# Patient Record
Sex: Male | Born: 1940 | Race: White | Hispanic: No | Marital: Married
Health system: Southern US, Community
[De-identification: ages and names within clinical notes are randomized; demographics above are authoritative.]

## PROBLEM LIST (undated history)

## (undated) DIAGNOSIS — J9621 Acute and chronic respiratory failure with hypoxia: Secondary | ICD-10-CM

## (undated) DIAGNOSIS — S272XXS Traumatic hemopneumothorax, sequela: Secondary | ICD-10-CM

## (undated) DIAGNOSIS — I482 Chronic atrial fibrillation, unspecified: Secondary | ICD-10-CM

## (undated) DIAGNOSIS — E871 Hypo-osmolality and hyponatremia: Secondary | ICD-10-CM

## (undated) DIAGNOSIS — I959 Hypotension, unspecified: Secondary | ICD-10-CM

## (undated) DIAGNOSIS — J189 Pneumonia, unspecified organism: Secondary | ICD-10-CM

---

## 2019-01-21 ENCOUNTER — Other Ambulatory Visit (HOSPITAL_COMMUNITY): Payer: Non-veteran care

## 2019-01-21 ENCOUNTER — Inpatient Hospital Stay
Admit: 2019-01-21 | Discharge: 2019-02-07 | Payer: Non-veteran care | Attending: Internal Medicine | Admitting: Internal Medicine

## 2019-01-21 DIAGNOSIS — J9621 Acute and chronic respiratory failure with hypoxia: Secondary | ICD-10-CM | POA: Diagnosis present

## 2019-01-21 DIAGNOSIS — I959 Hypotension, unspecified: Secondary | ICD-10-CM | POA: Diagnosis present

## 2019-01-21 DIAGNOSIS — J90 Pleural effusion, not elsewhere classified: Secondary | ICD-10-CM

## 2019-01-21 DIAGNOSIS — Z992 Dependence on renal dialysis: Secondary | ICD-10-CM

## 2019-01-21 DIAGNOSIS — J811 Chronic pulmonary edema: Secondary | ICD-10-CM

## 2019-01-21 DIAGNOSIS — S272XXS Traumatic hemopneumothorax, sequela: Secondary | ICD-10-CM

## 2019-01-21 DIAGNOSIS — J9690 Respiratory failure, unspecified, unspecified whether with hypoxia or hypercapnia: Secondary | ICD-10-CM

## 2019-01-21 DIAGNOSIS — J189 Pneumonia, unspecified organism: Secondary | ICD-10-CM

## 2019-01-21 DIAGNOSIS — R0603 Acute respiratory distress: Secondary | ICD-10-CM

## 2019-01-21 DIAGNOSIS — I509 Heart failure, unspecified: Secondary | ICD-10-CM

## 2019-01-21 DIAGNOSIS — N179 Acute kidney failure, unspecified: Secondary | ICD-10-CM

## 2019-01-21 DIAGNOSIS — I482 Chronic atrial fibrillation, unspecified: Secondary | ICD-10-CM | POA: Diagnosis present

## 2019-01-21 DIAGNOSIS — R509 Fever, unspecified: Secondary | ICD-10-CM

## 2019-01-21 DIAGNOSIS — Z930 Tracheostomy status: Secondary | ICD-10-CM

## 2019-01-21 DIAGNOSIS — Z4659 Encounter for fitting and adjustment of other gastrointestinal appliance and device: Secondary | ICD-10-CM

## 2019-01-21 DIAGNOSIS — E871 Hypo-osmolality and hyponatremia: Secondary | ICD-10-CM | POA: Diagnosis present

## 2019-01-21 HISTORY — DX: Hypotension, unspecified: I95.9

## 2019-01-21 HISTORY — DX: Acute and chronic respiratory failure with hypoxia: J96.21

## 2019-01-21 HISTORY — DX: Chronic atrial fibrillation, unspecified: I48.20

## 2019-01-21 HISTORY — DX: Pneumonia, unspecified organism: J18.9

## 2019-01-21 HISTORY — DX: Traumatic hemopneumothorax, sequela: S27.2XXS

## 2019-01-21 HISTORY — DX: Hypo-osmolality and hyponatremia: E87.1

## 2019-01-21 LAB — CBC WITH DIFFERENTIAL/PLATELET
Abs Immature Granulocytes: 0.09 10*3/uL — ABNORMAL HIGH (ref 0.00–0.07)
BASOS ABS: 0 10*3/uL (ref 0.0–0.1)
Basophils Relative: 0 %
EOS ABS: 0.2 10*3/uL (ref 0.0–0.5)
Eosinophils Relative: 1 %
HCT: 31.6 % — ABNORMAL LOW (ref 39.0–52.0)
Hemoglobin: 9.2 g/dL — ABNORMAL LOW (ref 13.0–17.0)
Immature Granulocytes: 1 %
Lymphocytes Relative: 10 %
Lymphs Abs: 1.2 10*3/uL (ref 0.7–4.0)
MCH: 29.7 pg (ref 26.0–34.0)
MCHC: 29.1 g/dL — ABNORMAL LOW (ref 30.0–36.0)
MCV: 101.9 fL — ABNORMAL HIGH (ref 80.0–100.0)
Monocytes Absolute: 0.5 10*3/uL (ref 0.1–1.0)
Monocytes Relative: 4 %
Neutro Abs: 10 10*3/uL — ABNORMAL HIGH (ref 1.7–7.7)
Neutrophils Relative %: 84 %
PLATELETS: UNDETERMINED 10*3/uL (ref 150–400)
RBC: 3.1 MIL/uL — ABNORMAL LOW (ref 4.22–5.81)
RDW: 16.2 % — AB (ref 11.5–15.5)
WBC: 12 10*3/uL — ABNORMAL HIGH (ref 4.0–10.5)
nRBC: 0 % (ref 0.0–0.2)

## 2019-01-21 LAB — COMPREHENSIVE METABOLIC PANEL
ALT: 60 U/L — ABNORMAL HIGH (ref 0–44)
AST: 53 U/L — ABNORMAL HIGH (ref 15–41)
Albumin: 1.5 g/dL — ABNORMAL LOW (ref 3.5–5.0)
Alkaline Phosphatase: 80 U/L (ref 38–126)
Anion gap: 2 — ABNORMAL LOW (ref 5–15)
BUN: 38 mg/dL — ABNORMAL HIGH (ref 8–23)
CO2: 31 mmol/L (ref 22–32)
Calcium: 7.7 mg/dL — ABNORMAL LOW (ref 8.9–10.3)
Chloride: 121 mmol/L — ABNORMAL HIGH (ref 98–111)
Creatinine, Ser: 1.04 mg/dL (ref 0.61–1.24)
GFR calc Af Amer: >60 mL/min (ref >60)
GFR calc non Af Amer: >60 mL/min (ref >60)
Glucose, Bld: 99 mg/dL (ref 70–99)
Potassium: 5.1 mmol/L (ref 3.5–5.1)
Sodium: 154 mmol/L — ABNORMAL HIGH (ref 135–145)
TOTAL PROTEIN: 6.2 g/dL — AB (ref 6.5–8.1)
Total Bilirubin: 0.4 mg/dL (ref 0.3–1.2)

## 2019-01-21 LAB — BLOOD GAS, ARTERIAL
Acid-Base Excess: 4.3 mmol/L — ABNORMAL HIGH (ref 0.0–2.0)
Bicarbonate: 30.1 mmol/L — ABNORMAL HIGH (ref 20.0–28.0)
FIO2: 40
O2 Saturation: 95.9 %
PEEP: 8 cmH2O
Patient temperature: 98.6
Pressure control: 24 cmH2O
pCO2 arterial: 61.6 mmHg — ABNORMAL HIGH (ref 32.0–48.0)
pH, Arterial: 7.31 — ABNORMAL LOW (ref 7.350–7.450)
pO2, Arterial: 88.1 mmHg (ref 83.0–108.0)

## 2019-01-21 LAB — PROTIME-INR
INR: 1.16
Prothrombin Time: 14.7 seconds (ref 11.4–15.2)

## 2019-01-22 DIAGNOSIS — E871 Hypo-osmolality and hyponatremia: Secondary | ICD-10-CM | POA: Diagnosis not present

## 2019-01-22 DIAGNOSIS — J189 Pneumonia, unspecified organism: Secondary | ICD-10-CM

## 2019-01-22 DIAGNOSIS — I482 Chronic atrial fibrillation, unspecified: Secondary | ICD-10-CM

## 2019-01-22 DIAGNOSIS — J9621 Acute and chronic respiratory failure with hypoxia: Secondary | ICD-10-CM

## 2019-01-22 DIAGNOSIS — I959 Hypotension, unspecified: Secondary | ICD-10-CM

## 2019-01-22 DIAGNOSIS — Z930 Tracheostomy status: Secondary | ICD-10-CM

## 2019-01-22 MED ORDER — GENERIC EXTERNAL MEDICATION
2.00 | Status: DC
Start: ? — End: 2019-01-22

## 2019-01-22 MED ORDER — ONDANSETRON HCL 4 MG/2ML IJ SOLN
4.00 | INTRAMUSCULAR | Status: DC
Start: ? — End: 2019-01-22

## 2019-01-22 MED ORDER — GENERIC EXTERNAL MEDICATION
Status: DC
Start: ? — End: 2019-01-22

## 2019-01-22 MED ORDER — IPRATROPIUM-ALBUTEROL 0.5-2.5 (3) MG/3ML IN SOLN
3.00 | RESPIRATORY_TRACT | Status: DC
Start: ? — End: 2019-01-22

## 2019-01-22 MED ORDER — CHLORHEXIDINE GLUCONATE 0.12 % MT SOLN
15.00 | OROMUCOSAL | Status: DC
Start: ? — End: 2019-01-22

## 2019-01-22 MED ORDER — ROSUVASTATIN CALCIUM 20 MG PO TABS
40.00 | ORAL_TABLET | ORAL | Status: DC
Start: 2019-01-21 — End: 2019-01-22

## 2019-01-22 MED ORDER — GENERIC EXTERNAL MEDICATION
40.00 | Status: DC
Start: 2019-01-22 — End: 2019-01-22

## 2019-01-22 MED ORDER — METOPROLOL TARTRATE 25 MG PO TABS
12.50 | ORAL_TABLET | ORAL | Status: DC
Start: 2019-01-21 — End: 2019-01-22

## 2019-01-22 MED ORDER — LEVOTHYROXINE SODIUM 25 MCG PO TABS
25.00 | ORAL_TABLET | ORAL | Status: DC
Start: 2019-01-22 — End: 2019-01-22

## 2019-01-22 MED ORDER — CHLORHEXIDINE GLUCONATE 0.12 % MT SOLN
15.00 | OROMUCOSAL | Status: DC
Start: 2019-01-21 — End: 2019-01-22

## 2019-01-22 MED ORDER — AMIODARONE HCL 200 MG PO TABS
200.00 | ORAL_TABLET | ORAL | Status: DC
Start: 2019-01-22 — End: 2019-01-22

## 2019-01-22 MED ORDER — GENERIC EXTERNAL MEDICATION
Status: DC
Start: 2019-01-22 — End: 2019-01-22

## 2019-01-22 MED ORDER — OXYCODONE-ACETAMINOPHEN 5-325 MG PO TABS
1.00 | ORAL_TABLET | ORAL | Status: DC
Start: ? — End: 2019-01-22

## 2019-01-22 MED ORDER — GENERIC EXTERNAL MEDICATION
400.00 | Status: DC
Start: 2019-01-21 — End: 2019-01-22

## 2019-01-22 MED ORDER — SENNOSIDES-DOCUSATE SODIUM 8.6-50 MG PO TABS
2.00 | ORAL_TABLET | ORAL | Status: DC
Start: 2019-01-21 — End: 2019-01-22

## 2019-01-22 MED ORDER — IPRATROPIUM-ALBUTEROL 0.5-2.5 (3) MG/3ML IN SOLN
3.00 | RESPIRATORY_TRACT | Status: DC
Start: 2019-01-21 — End: 2019-01-22

## 2019-01-22 MED ORDER — ACETAMINOPHEN 325 MG PO TABS
325.00 | ORAL_TABLET | ORAL | Status: DC
Start: 2019-01-21 — End: 2019-01-22
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
## 2019-01-23 ENCOUNTER — Other Ambulatory Visit (HOSPITAL_COMMUNITY): Payer: Non-veteran care

## 2019-01-23 ENCOUNTER — Encounter: Payer: Self-pay | Admitting: Internal Medicine

## 2019-01-23 DIAGNOSIS — I482 Chronic atrial fibrillation, unspecified: Secondary | ICD-10-CM | POA: Diagnosis not present

## 2019-01-23 DIAGNOSIS — J189 Pneumonia, unspecified organism: Secondary | ICD-10-CM | POA: Diagnosis not present

## 2019-01-23 DIAGNOSIS — R509 Fever, unspecified: Secondary | ICD-10-CM | POA: Diagnosis not present

## 2019-01-23 DIAGNOSIS — E871 Hypo-osmolality and hyponatremia: Secondary | ICD-10-CM | POA: Diagnosis present

## 2019-01-23 DIAGNOSIS — S272XXS Traumatic hemopneumothorax, sequela: Secondary | ICD-10-CM

## 2019-01-23 DIAGNOSIS — J9621 Acute and chronic respiratory failure with hypoxia: Secondary | ICD-10-CM | POA: Diagnosis not present

## 2019-01-23 DIAGNOSIS — I959 Hypotension, unspecified: Secondary | ICD-10-CM | POA: Diagnosis present

## 2019-01-23 LAB — CBC
HCT: 29.6 % — ABNORMAL LOW (ref 39.0–52.0)
HEMATOCRIT: 26.3 % — AB (ref 39.0–52.0)
Hemoglobin: 7.6 g/dL — ABNORMAL LOW (ref 13.0–17.0)
Hemoglobin: 8.5 g/dL — ABNORMAL LOW (ref 13.0–17.0)
MCH: 30.6 pg (ref 26.0–34.0)
MCH: 30.6 pg (ref 26.0–34.0)
MCHC: 28.9 g/dL — ABNORMAL LOW (ref 30.0–36.0)
MCHC: 28.7 g/dL — ABNORMAL LOW (ref 30.0–36.0)
MCV: 106 fL — ABNORMAL HIGH (ref 80.0–100.0)
MCV: 106.5 fL — AB (ref 80.0–100.0)
NRBC: 0 % (ref 0.0–0.2)
Platelets: 122 10*3/uL — ABNORMAL LOW (ref 150–400)
Platelets: DECREASED 10*3/uL (ref 150–400)
RBC: 2.48 MIL/uL — ABNORMAL LOW (ref 4.22–5.81)
RBC: 2.78 MIL/uL — ABNORMAL LOW (ref 4.22–5.81)
RDW: 16.2 % — AB (ref 11.5–15.5)
RDW: 16.1 % — ABNORMAL HIGH (ref 11.5–15.5)
WBC: 11.9 10*3/uL — ABNORMAL HIGH (ref 4.0–10.5)
WBC: 12.7 10*3/uL — ABNORMAL HIGH (ref 4.0–10.5)
nRBC: 0 % (ref 0.0–0.2)

## 2019-01-23 LAB — PROTIME-INR
INR: 1.31
Prothrombin Time: 16.1 seconds — ABNORMAL HIGH (ref 11.4–15.2)

## 2019-01-23 LAB — URINALYSIS, ROUTINE W REFLEX MICROSCOPIC
Bilirubin Urine: NEGATIVE
GLUCOSE, UA: NEGATIVE mg/dL
Hgb urine dipstick: NEGATIVE
Ketones, ur: NEGATIVE mg/dL
LEUKOCYTES UA: NEGATIVE
Nitrite: NEGATIVE
PROTEIN: NEGATIVE mg/dL
Specific Gravity, Urine: 1.021 (ref 1.005–1.030)
pH: 5 (ref 5.0–8.0)

## 2019-01-23 LAB — BASIC METABOLIC PANEL
Anion gap: 7 (ref 5–15)
BUN: 61 mg/dL — ABNORMAL HIGH (ref 8–23)
CO2: 24 mmol/L (ref 22–32)
Calcium: 7.4 mg/dL — ABNORMAL LOW (ref 8.9–10.3)
Chloride: 121 mmol/L — ABNORMAL HIGH (ref 98–111)
Creatinine, Ser: 1.8 mg/dL — ABNORMAL HIGH (ref 0.61–1.24)
GFR calc Af Amer: 41 mL/min — ABNORMAL LOW (ref >60)
GFR calc non Af Amer: 36 mL/min — ABNORMAL LOW (ref >60)
Glucose, Bld: 116 mg/dL — ABNORMAL HIGH (ref 70–99)
Potassium: 5.5 mmol/L — ABNORMAL HIGH (ref 3.5–5.1)
Sodium: 152 mmol/L — ABNORMAL HIGH (ref 135–145)

## 2019-01-23 MED ORDER — IOHEXOL 300 MG/ML  SOLN
100.0000 mL | Freq: Once | INTRAMUSCULAR | Status: AC | PRN
  Administered 2019-01-23: 100 mL via INTRAVENOUS
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
## 2019-01-24 DIAGNOSIS — J9621 Acute and chronic respiratory failure with hypoxia: Secondary | ICD-10-CM | POA: Diagnosis not present

## 2019-01-24 DIAGNOSIS — I482 Chronic atrial fibrillation, unspecified: Secondary | ICD-10-CM | POA: Diagnosis not present

## 2019-01-24 DIAGNOSIS — J189 Pneumonia, unspecified organism: Secondary | ICD-10-CM | POA: Diagnosis not present

## 2019-01-24 DIAGNOSIS — I959 Hypotension, unspecified: Secondary | ICD-10-CM | POA: Diagnosis not present

## 2019-01-24 LAB — CBC
HCT: 26.7 % — ABNORMAL LOW (ref 39.0–52.0)
HCT: 26 % — ABNORMAL LOW (ref 39.0–52.0)
HCT: 25.4 % — ABNORMAL LOW (ref 39.0–52.0)
HEMATOCRIT: 26.5 % — AB (ref 39.0–52.0)
HEMOGLOBIN: 7.4 g/dL — AB (ref 13.0–17.0)
Hemoglobin: 7.7 g/dL — ABNORMAL LOW (ref 13.0–17.0)
Hemoglobin: 7.3 g/dL — ABNORMAL LOW (ref 13.0–17.0)
Hemoglobin: 7.4 g/dL — ABNORMAL LOW (ref 13.0–17.0)
MCH: 30.9 pg (ref 26.0–34.0)
MCH: 29.6 pg (ref 26.0–34.0)
MCH: 31.1 pg (ref 26.0–34.0)
MCH: 29.8 pg (ref 26.0–34.0)
MCHC: 28.8 g/dL — ABNORMAL LOW (ref 30.0–36.0)
MCHC: 28.1 g/dL — ABNORMAL LOW (ref 30.0–36.0)
MCHC: 29.1 g/dL — ABNORMAL LOW (ref 30.0–36.0)
MCHC: 27.9 g/dL — ABNORMAL LOW (ref 30.0–36.0)
MCV: 107.2 fL — ABNORMAL HIGH (ref 80.0–100.0)
MCV: 105.3 fL — ABNORMAL HIGH (ref 80.0–100.0)
MCV: 106.7 fL — ABNORMAL HIGH (ref 80.0–100.0)
MCV: 106.9 fL — ABNORMAL HIGH (ref 80.0–100.0)
Platelets: 121 10*3/uL — ABNORMAL LOW (ref 150–400)
Platelets: 103 10*3/uL — ABNORMAL LOW (ref 150–400)
Platelets: 114 10*3/uL — ABNORMAL LOW (ref 150–400)
Platelets: 110 10*3/uL — ABNORMAL LOW (ref 150–400)
RBC: 2.49 MIL/uL — ABNORMAL LOW (ref 4.22–5.81)
RBC: 2.47 MIL/uL — ABNORMAL LOW (ref 4.22–5.81)
RBC: 2.38 MIL/uL — ABNORMAL LOW (ref 4.22–5.81)
RBC: 2.48 MIL/uL — ABNORMAL LOW (ref 4.22–5.81)
RDW: 16.7 % — ABNORMAL HIGH (ref 11.5–15.5)
RDW: 16.4 % — ABNORMAL HIGH (ref 11.5–15.5)
RDW: 16.8 % — ABNORMAL HIGH (ref 11.5–15.5)
RDW: 16.7 % — ABNORMAL HIGH (ref 11.5–15.5)
WBC: 10.5 10*3/uL (ref 4.0–10.5)
WBC: 9.7 10*3/uL (ref 4.0–10.5)
WBC: 11.3 10*3/uL — AB (ref 4.0–10.5)
WBC: 12.6 10*3/uL — ABNORMAL HIGH (ref 4.0–10.5)
nRBC: 0 % (ref 0.0–0.2)
nRBC: 0 % (ref 0.0–0.2)
nRBC: 0 % (ref 0.0–0.2)
nRBC: 0 % (ref 0.0–0.2)

## 2019-01-24 LAB — BLOOD CULTURE ID PANEL (REFLEXED)
Acinetobacter baumannii: NOT DETECTED
Candida albicans: NOT DETECTED
Candida glabrata: NOT DETECTED
Candida krusei: NOT DETECTED
Candida parapsilosis: NOT DETECTED
Candida tropicalis: NOT DETECTED
Enterobacter cloacae complex: NOT DETECTED
Enterobacteriaceae species: NOT DETECTED
Enterococcus species: NOT DETECTED
Escherichia coli: NOT DETECTED
Haemophilus influenzae: NOT DETECTED
Klebsiella oxytoca: NOT DETECTED
Klebsiella pneumoniae: NOT DETECTED
Listeria monocytogenes: NOT DETECTED
METHICILLIN RESISTANCE: DETECTED — AB
Neisseria meningitidis: NOT DETECTED
PROTEUS SPECIES: NOT DETECTED
Pseudomonas aeruginosa: NOT DETECTED
STAPHYLOCOCCUS SPECIES: DETECTED — AB
Serratia marcescens: NOT DETECTED
Staphylococcus aureus (BCID): NOT DETECTED
Streptococcus agalactiae: NOT DETECTED
Streptococcus pneumoniae: NOT DETECTED
Streptococcus pyogenes: NOT DETECTED
Streptococcus species: NOT DETECTED

## 2019-01-24 LAB — VANCOMYCIN, TROUGH: Vancomycin Tr: 20 ug/mL (ref 15–20)

## 2019-01-24 LAB — BASIC METABOLIC PANEL
ANION GAP: 8 (ref 5–15)
BUN: 68 mg/dL — ABNORMAL HIGH (ref 8–23)
CO2: 24 mmol/L (ref 22–32)
Calcium: 7.4 mg/dL — ABNORMAL LOW (ref 8.9–10.3)
Chloride: 120 mmol/L — ABNORMAL HIGH (ref 98–111)
Creatinine, Ser: 1.99 mg/dL — ABNORMAL HIGH (ref 0.61–1.24)
GFR calc Af Amer: 36 mL/min — ABNORMAL LOW (ref >60)
GFR calc non Af Amer: 31 mL/min — ABNORMAL LOW (ref >60)
Glucose, Bld: 125 mg/dL — ABNORMAL HIGH (ref 70–99)
POTASSIUM: 5.1 mmol/L (ref 3.5–5.1)
Sodium: 152 mmol/L — ABNORMAL HIGH (ref 135–145)

## 2019-01-24 LAB — URINE CULTURE: Culture: NO GROWTH

## 2019-01-24 LAB — C DIFFICILE QUICK SCREEN W PCR REFLEX
C DIFFICLE (CDIFF) ANTIGEN: POSITIVE — AB
C Diff toxin: NEGATIVE

## 2019-01-24 LAB — CLOSTRIDIUM DIFFICILE BY PCR, REFLEXED: CDIFFPCR: POSITIVE — AB

## 2019-01-24 NOTE — Progress Notes (Addendum)
Pulmonary Critical Care Medicine [place]   PULMONARY CRITICAL CARE SERVICE  PROGRESS NOTE  Date of Service: 01/24/2019  Gabriel Jensen  YTK:[id number]  DOB: Dec 09, 1941   DOA: 01/21/2019  Referring Physician: Merton Border, MD  HPI: Enrique Manganaro is a 78 y.o. male seen for follow up of Acute on Chronic Respiratory Failure.  Patient remains unstable for weaning at this time.  Continues on full support via ventilator.  Moderate secretions noted.  Medications: Reviewed on Rounds  Physical Exam:  Vitals: Pulse 86 respiration 16 BP 118/72 O2 sat 97% temp 97.0  Ventilator Settings ventilator mode AC PC rate 12 IP 24 PEEP of 5 FiO2 50%  . General: Comfortable at this time . Eyes: Grossly normal lids, irises & conjunctiva . ENT: grossly tongue is normal . Neck: no obvious mass . Cardiovascular: S1 S2 normal no gallop . Respiratory: Scattered rhonchi equal expansion . Abdomen: soft . Skin: no rash seen on limited exam . Musculoskeletal: not rigid . Psychiatric:unable to assess . Neurologic: no seizure no involuntary movements         Lab Data:   Basic Metabolic Panel: Recent Labs  Lab 01/21/19 1702 01/23/19 1636 01/24/19 0902  NA 154* 152* 152*  K 5.1 5.5* 5.1  CL 121* 121* 120*  CO2 31 24 24   GLUCOSE 99 116* 125*  BUN 38* 61* 68*  CREATININE 1.04 1.80* 1.99*  CALCIUM 7.7* 7.4* 7.4*    ABG: Recent Labs  Lab 01/21/19 1746  PHART 7.310*  PCO2ART 61.6*  PO2ART 88.1  HCO3 30.1*  O2SAT 95.9    Liver Function Tests: Recent Labs  Lab 01/21/19 1702  AST 53*  ALT 60*  ALKPHOS 80  BILITOT 0.4  PROT 6.2*  ALBUMIN 1.5*   No results for input(s): LIPASE, AMYLASE in the last 168 hours. No results for input(s): AMMONIA in the last 168 hours.  CBC: Recent Labs  Lab 01/21/19 1702  01/23/19 1949 01/24/19 0248 01/24/19 0723 01/24/19 1308 01/24/19 1909  WBC 12.0*   < > 12.7* 10.5 9.7 11.3* 12.6*  NEUTROABS 10.0*  --   --   --   --   --   --    HGB 9.2*   < > 8.5* 7.7* 7.3* 7.4* 7.4*  HCT 31.6*   < > 29.6* 26.7* 26.0* 25.4* 26.5*  MCV 101.9*   < > 106.5* 107.2* 105.3* 106.7* 106.9*  PLT PLATELET CLUMPS NOTED ON SMEAR, UNABLE TO ESTIMATE   < > PLATELET CLUMPS NOTED ON SMEAR, COUNT APPEARS DECREASED 121* 103* 114* 110*   < > = values in this interval not displayed.    Cardiac Enzymes: No results for input(s): CKTOTAL, CKMB, CKMBINDEX, TROPONINI in the last 168 hours.  BNP (last 3 results) No results for input(s): BNP in the last 8760 hours.  ProBNP (last 3 results) No results for input(s): PROBNP in the last 8760 hours.  Radiological Exams: Ct Abdomen Pelvis W Contrast  Result Date: 01/24/2019 CLINICAL DATA:  Retroperitoneal abscess EXAM: CT ABDOMEN AND PELVIS WITH CONTRAST TECHNIQUE: Multidetector CT imaging of the abdomen and pelvis was performed using the standard protocol following bolus administration of intravenous contrast. CONTRAST:  17mL OMNIPAQUE IOHEXOL 300 MG/ML  SOLN COMPARISON:  None. FINDINGS: Lower chest: Dense consolidation in both lower lobes. Moderate bilateral pleural effusions. Heart is normal size. Hepatobiliary: Scattered calcifications. No suspicious focal hepatic abnormality. Gallbladder is contracted. Pancreas: No focal abnormality or ductal dilatation. Spleen: Scattered calcifications. Adrenals/Urinary Tract: Left renal cysts, the largest  in the upper pole measures 3.2 cm. No hydronephrosis. Adrenal glands and urinary bladder unremarkable. Gas seen within the urinary bladder, presumably from recent catheterization. Stomach/Bowel: Appears to be mild colonic wall thickening diffusely although this is difficult to confirm or evaluate due to decompressed colon. Cannot exclude colitis. Sigmoid diverticulosis. Small bowel and stomach decompressed, unremarkable. NG tube is in place with the tip in the proximal duodenum. Vascular/Lymphatic: Prior aortic stent graft repair. Residual aneurysm sac measures 4 cm. No  adenopathy. Reproductive: No visible focal abnormality. Other: Small amount of free fluid in the pelvis. No free air. Musculoskeletal: No acute bony abnormality. IMPRESSION: Moderate bilateral effusions with dense consolidation in both lower lobes concerning for pneumonia. Apparent diffuse colonic wall thickening although difficult to evaluate due to decompression of the colon. Findings are concerning for pancolitis. Scattered left colonic diverticula. Prior stent graft repair of AAA. Aneurysm sac size 4 cm maximally. 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 Electronically Signed   By: Rolm Baptise M.D.   On: 01/24/2019 00:03   Dg Chest Port 1 View  Result Date: 01/23/2019 CLINICAL DATA:  Fever.  Follow-up. EXAM: PORTABLE CHEST 1 VIEW COMPARISON:  01/21/2019 FINDINGS: Tracheostomy remains in place. Nasogastric tube enters the abdomen. Widespread bilateral pulmonary opacity persists consistent with diffuse pneumonia. No dense consolidation or lobar collapse. Small bilateral effusions. IMPRESSION: Similar appearance to the study of 2 days ago. Widespread pulmonary infiltrates and small effusions. Electronically Signed   By: Nelson Chimes M.D.   On: 01/23/2019 13:40    Assessment/Plan Active Problems:   Acute on chronic respiratory failure with hypoxia (HCC)   Hyponatremia   Bilateral pneumonia   Chronic atrial fibrillation   Hemothorax with pneumothorax, traumatic, sequela   Hypotension   1. Acute on chronic respiratory failure with hypoxia patient remains on ventilator full support at this time.  Continue this full support and treat oxygen as tolerated. 2. Bilateral pneumonia patient had bilateral pleural effusions noted on CT scan.  Continue to follow cultures. 3. Chronic atrial fibrillation rate controlled continue present management 4. Hemothorax resolved, follow-up chest x-ray recommended 5. Hypertension resolved at this time 6. Hyponatremia ,sodium 152 this a.m.   I  have personally seen and evaluated the patient, evaluated laboratory and imaging results, formulated the assessment and plan and placed orders. The Patient requires high complexity decision making for assessment and support.  Case was discussed on Rounds with the Respiratory Therapy Staff  Allyne Gee, MD [facility] Pulmonary Critical Care Medicine Sleep Medicine

## 2019-01-25 DIAGNOSIS — I959 Hypotension, unspecified: Secondary | ICD-10-CM | POA: Diagnosis not present

## 2019-01-25 DIAGNOSIS — I482 Chronic atrial fibrillation, unspecified: Secondary | ICD-10-CM | POA: Diagnosis not present

## 2019-01-25 DIAGNOSIS — J189 Pneumonia, unspecified organism: Secondary | ICD-10-CM | POA: Diagnosis not present

## 2019-01-25 DIAGNOSIS — J9621 Acute and chronic respiratory failure with hypoxia: Secondary | ICD-10-CM | POA: Diagnosis not present

## 2019-01-25 LAB — CBC
HCT: 26.2 % — ABNORMAL LOW (ref 39.0–52.0)
HCT: 26.5 % — ABNORMAL LOW (ref 39.0–52.0)
HCT: 26.7 % — ABNORMAL LOW (ref 39.0–52.0)
HCT: 24.9 % — ABNORMAL LOW (ref 39.0–52.0)
Hemoglobin: 7.5 g/dL — ABNORMAL LOW (ref 13.0–17.0)
Hemoglobin: 7.4 g/dL — ABNORMAL LOW (ref 13.0–17.0)
Hemoglobin: 7.5 g/dL — ABNORMAL LOW (ref 13.0–17.0)
Hemoglobin: 7 g/dL — ABNORMAL LOW (ref 13.0–17.0)
MCH: 30.5 pg (ref 26.0–34.0)
MCH: 29.8 pg (ref 26.0–34.0)
MCH: 30 pg (ref 26.0–34.0)
MCH: 29.8 pg (ref 26.0–34.0)
MCHC: 28.6 g/dL — ABNORMAL LOW (ref 30.0–36.0)
MCHC: 27.9 g/dL — AB (ref 30.0–36.0)
MCHC: 28.1 g/dL — ABNORMAL LOW (ref 30.0–36.0)
MCHC: 28.1 g/dL — ABNORMAL LOW (ref 30.0–36.0)
MCV: 106.5 fL — ABNORMAL HIGH (ref 80.0–100.0)
MCV: 106.9 fL — ABNORMAL HIGH (ref 80.0–100.0)
MCV: 106.8 fL — ABNORMAL HIGH (ref 80.0–100.0)
MCV: 106 fL — AB (ref 80.0–100.0)
PLATELETS: 120 10*3/uL — AB (ref 150–400)
Platelets: 115 10*3/uL — ABNORMAL LOW (ref 150–400)
Platelets: 120 10*3/uL — ABNORMAL LOW (ref 150–400)
Platelets: 120 10*3/uL — ABNORMAL LOW (ref 150–400)
RBC: 2.46 MIL/uL — AB (ref 4.22–5.81)
RBC: 2.48 MIL/uL — ABNORMAL LOW (ref 4.22–5.81)
RBC: 2.5 MIL/uL — ABNORMAL LOW (ref 4.22–5.81)
RBC: 2.35 MIL/uL — ABNORMAL LOW (ref 4.22–5.81)
RDW: 16.6 % — ABNORMAL HIGH (ref 11.5–15.5)
RDW: 16.5 % — ABNORMAL HIGH (ref 11.5–15.5)
RDW: 16.4 % — AB (ref 11.5–15.5)
RDW: 16.4 % — ABNORMAL HIGH (ref 11.5–15.5)
WBC: 12.6 10*3/uL — ABNORMAL HIGH (ref 4.0–10.5)
WBC: 14.2 10*3/uL — ABNORMAL HIGH (ref 4.0–10.5)
WBC: 15.9 10*3/uL — ABNORMAL HIGH (ref 4.0–10.5)
WBC: 16.1 10*3/uL — ABNORMAL HIGH (ref 4.0–10.5)
nRBC: 0 % (ref 0.0–0.2)
nRBC: 0.1 % (ref 0.0–0.2)
nRBC: 0.1 % (ref 0.0–0.2)
nRBC: 0.2 % (ref 0.0–0.2)

## 2019-01-25 LAB — BASIC METABOLIC PANEL
Anion gap: 6 (ref 5–15)
BUN: 71 mg/dL — ABNORMAL HIGH (ref 8–23)
CO2: 23 mmol/L (ref 22–32)
Calcium: 7.6 mg/dL — ABNORMAL LOW (ref 8.9–10.3)
Chloride: 121 mmol/L — ABNORMAL HIGH (ref 98–111)
Creatinine, Ser: 2.14 mg/dL — ABNORMAL HIGH (ref 0.61–1.24)
GFR calc Af Amer: 33 mL/min — ABNORMAL LOW (ref >60)
GFR, EST NON AFRICAN AMERICAN: 29 mL/min — AB (ref >60)
Glucose, Bld: 179 mg/dL — ABNORMAL HIGH (ref 70–99)
POTASSIUM: 5.5 mmol/L — AB (ref 3.5–5.1)
Sodium: 150 mmol/L — ABNORMAL HIGH (ref 135–145)

## 2019-01-25 LAB — CULTURE, RESPIRATORY W GRAM STAIN

## 2019-01-25 LAB — CULTURE, BLOOD (ROUTINE X 2)

## 2019-01-25 NOTE — Progress Notes (Addendum)
Pulmonary Critical Care Medicine [place]   PULMONARY CRITICAL CARE SERVICE  PROGRESS NOTE  Date of Service: 01/25/2019  Arzell Mcgeehan  YKD:[id number]  DOB: December 24, 1940   DOA: 01/21/2019  Referring Physician: Merton Border, MD  HPI: Gabriel Jensen is a 78 y.o. male seen for follow up of Acute on Chronic Respiratory Failure.  Patient doing better today.  He continues on full ventilator support.  He has moderate to severe secretions noted.  We are still waiting on blood culture results at this time.  Medications: Reviewed on Rounds  Physical Exam:  Vitals: Pulse 68 respirations 28 BP 105/63 O2 sat 98% temp 97.6  Ventilator Settings ventilator mode AC PC rate of 12 IP 24 PEEP of 5 FiO2 40%  . General: Comfortable at this time . Eyes: Grossly normal lids, irises & conjunctiva . ENT: grossly tongue is normal . Neck: no obvious mass . Cardiovascular: S1 S2 normal no gallop . Respiratory: Scattered rhonchi expansion is equal . Abdomen: soft . Skin: no rash seen on limited exam . Musculoskeletal: not rigid . Psychiatric:unable to assess . Neurologic: no seizure no involuntary movements         Lab Data:   Basic Metabolic Panel: Recent Labs  Lab 01/21/19 1702 01/23/19 1636 01/24/19 0902 01/25/19 0156  NA 154* 152* 152* 150*  K 5.1 5.5* 5.1 5.5*  CL 121* 121* 120* 121*  CO2 31 24 24 23   GLUCOSE 99 116* 125* 179*  BUN 38* 61* 68* 71*  CREATININE 1.04 1.80* 1.99* 2.14*  CALCIUM 7.7* 7.4* 7.4* 7.6*    ABG: Recent Labs  Lab 01/21/19 1746  PHART 7.310*  PCO2ART 61.6*  PO2ART 88.1  HCO3 30.1*  O2SAT 95.9    Liver Function Tests: Recent Labs  Lab 01/21/19 1702  AST 53*  ALT 60*  ALKPHOS 80  BILITOT 0.4  PROT 6.2*  ALBUMIN 1.5*   No results for input(s): LIPASE, AMYLASE in the last 168 hours. No results for input(s): AMMONIA in the last 168 hours.  CBC: Recent Labs  Lab 01/21/19 1702  01/24/19 1308 01/24/19 1909 01/25/19 0156  01/25/19 0719 01/25/19 1304  WBC 12.0*   < > 11.3* 12.6* 12.6* 14.2* 15.9*  NEUTROABS 10.0*  --   --   --   --   --   --   HGB 9.2*   < > 7.4* 7.4* 7.5* 7.4* 7.5*  HCT 31.6*   < > 25.4* 26.5* 26.2* 26.5* 26.7*  MCV 101.9*   < > 106.7* 106.9* 106.5* 106.9* 106.8*  PLT PLATELET CLUMPS NOTED ON SMEAR, UNABLE TO ESTIMATE   < > 114* 110* 115* 120* 120*   < > = values in this interval not displayed.    Cardiac Enzymes: No results for input(s): CKTOTAL, CKMB, CKMBINDEX, TROPONINI in the last 168 hours.  BNP (last 3 results) No results for input(s): BNP in the last 8760 hours.  ProBNP (last 3 results) No results for input(s): PROBNP in the last 8760 hours.  Radiological Exams: Ct Abdomen Pelvis W Contrast  Result Date: 01/24/2019 CLINICAL DATA:  Retroperitoneal abscess EXAM: CT ABDOMEN AND PELVIS WITH CONTRAST TECHNIQUE: Multidetector CT imaging of the abdomen and pelvis was performed using the standard protocol following bolus administration of intravenous contrast. CONTRAST:  153mL OMNIPAQUE IOHEXOL 300 MG/ML  SOLN COMPARISON:  None. FINDINGS: Lower chest: Dense consolidation in both lower lobes. Moderate bilateral pleural effusions. Heart is normal size. Hepatobiliary: Scattered calcifications. No suspicious focal hepatic abnormality. Gallbladder is contracted.  Pancreas: No focal abnormality or ductal dilatation. Spleen: Scattered calcifications. Adrenals/Urinary Tract: Left renal cysts, the largest in the upper pole measures 3.2 cm. No hydronephrosis. Adrenal glands and urinary bladder unremarkable. Gas seen within the urinary bladder, presumably from recent catheterization. Stomach/Bowel: Appears to be mild colonic wall thickening diffusely although this is difficult to confirm or evaluate due to decompressed colon. Cannot exclude colitis. Sigmoid diverticulosis. Small bowel and stomach decompressed, unremarkable. NG tube is in place with the tip in the proximal duodenum. Vascular/Lymphatic:  Prior aortic stent graft repair. Residual aneurysm sac measures 4 cm. No adenopathy. Reproductive: No visible focal abnormality. Other: Small amount of free fluid in the pelvis. No free air. Musculoskeletal: No acute bony abnormality. IMPRESSION: Moderate bilateral effusions with dense consolidation in both lower lobes concerning for pneumonia. Apparent diffuse colonic wall thickening although difficult to evaluate due to decompression of the colon. Findings are concerning for pancolitis. Scattered left colonic diverticula. Prior stent graft repair of AAA. Aneurysm sac size 4 cm maximally. 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 Electronically Signed   By: Rolm Baptise M.D.   On: 01/24/2019 00:03    Assessment/Plan Active Problems:   Acute on chronic respiratory failure with hypoxia (HCC)   Hyponatremia   Bilateral pneumonia   Chronic atrial fibrillation   Hemothorax with pneumothorax, traumatic, sequela   Hypotension   1. Acute on chronic respiratory failure with hypoxia patient mains on ventilator full support at this time.  Continue full support and wean as tolerated per protocol. 2. Bilateral pneumonia continue to await cultures. 3. Chronic atrial fibrillation rate controlled continue present management 4. Hemothorax resolved follow-up chest x-ray recommended 5. Hypertension resolved at this time 6. Hyponatremia sodium was 150 at draw yesterday.   I have personally seen and evaluated the patient, evaluated laboratory and imaging results, formulated the assessment and plan and placed orders. The Patient requires high complexity decision making for assessment and support.  Case was discussed on Rounds with the Respiratory Therapy Staff  Allyne Gee, MD [facility] Pulmonary Critical Care Medicine Sleep Medicine

## 2019-01-26 ENCOUNTER — Other Ambulatory Visit (HOSPITAL_COMMUNITY): Payer: Non-veteran care

## 2019-01-26 DIAGNOSIS — J189 Pneumonia, unspecified organism: Secondary | ICD-10-CM | POA: Diagnosis not present

## 2019-01-26 DIAGNOSIS — J9621 Acute and chronic respiratory failure with hypoxia: Secondary | ICD-10-CM | POA: Diagnosis not present

## 2019-01-26 DIAGNOSIS — I482 Chronic atrial fibrillation, unspecified: Secondary | ICD-10-CM | POA: Diagnosis not present

## 2019-01-26 DIAGNOSIS — I959 Hypotension, unspecified: Secondary | ICD-10-CM | POA: Diagnosis not present

## 2019-01-26 LAB — CBC
HCT: 26.3 % — ABNORMAL LOW (ref 39.0–52.0)
HCT: 23.9 % — ABNORMAL LOW (ref 39.0–52.0)
HCT: 25.9 % — ABNORMAL LOW (ref 39.0–52.0)
HEMOGLOBIN: 7.5 g/dL — AB (ref 13.0–17.0)
Hemoglobin: 7 g/dL — ABNORMAL LOW (ref 13.0–17.0)
Hemoglobin: 7.3 g/dL — ABNORMAL LOW (ref 13.0–17.0)
MCH: 29.8 pg (ref 26.0–34.0)
MCH: 30.6 pg (ref 26.0–34.0)
MCH: 29.8 pg (ref 26.0–34.0)
MCHC: 28.5 g/dL — ABNORMAL LOW (ref 30.0–36.0)
MCHC: 29.3 g/dL — ABNORMAL LOW (ref 30.0–36.0)
MCHC: 28.2 g/dL — AB (ref 30.0–36.0)
MCV: 104.4 fL — ABNORMAL HIGH (ref 80.0–100.0)
MCV: 104.4 fL — ABNORMAL HIGH (ref 80.0–100.0)
MCV: 105.7 fL — ABNORMAL HIGH (ref 80.0–100.0)
NRBC: 0.2 % (ref 0.0–0.2)
Platelets: 132 10*3/uL — ABNORMAL LOW (ref 150–400)
Platelets: 92 10*3/uL — ABNORMAL LOW (ref 150–400)
Platelets: 109 10*3/uL — ABNORMAL LOW (ref 150–400)
RBC: 2.52 MIL/uL — ABNORMAL LOW (ref 4.22–5.81)
RBC: 2.29 MIL/uL — ABNORMAL LOW (ref 4.22–5.81)
RBC: 2.45 MIL/uL — AB (ref 4.22–5.81)
RDW: 16.3 % — ABNORMAL HIGH (ref 11.5–15.5)
RDW: 16.2 % — ABNORMAL HIGH (ref 11.5–15.5)
RDW: 16.4 % — ABNORMAL HIGH (ref 11.5–15.5)
WBC: 18.1 10*3/uL — AB (ref 4.0–10.5)
WBC: 12.7 10*3/uL — ABNORMAL HIGH (ref 4.0–10.5)
WBC: 17.3 10*3/uL — ABNORMAL HIGH (ref 4.0–10.5)
nRBC: 0.2 % (ref 0.0–0.2)
nRBC: 0.3 % — ABNORMAL HIGH (ref 0.0–0.2)

## 2019-01-26 LAB — CBC WITH DIFFERENTIAL/PLATELET
Abs Immature Granulocytes: 0.1 10*3/uL — ABNORMAL HIGH (ref 0.00–0.07)
Basophils Absolute: 0 10*3/uL (ref 0.0–0.1)
Basophils Relative: 0 %
Eosinophils Absolute: 0 10*3/uL (ref 0.0–0.5)
Eosinophils Relative: 0 %
HCT: 25 % — ABNORMAL LOW (ref 39.0–52.0)
HEMOGLOBIN: 7.4 g/dL — AB (ref 13.0–17.0)
Immature Granulocytes: 1 %
LYMPHS ABS: 0.7 10*3/uL (ref 0.7–4.0)
LYMPHS PCT: 4 %
MCH: 31.2 pg (ref 26.0–34.0)
MCHC: 29.6 g/dL — ABNORMAL LOW (ref 30.0–36.0)
MCV: 105.5 fL — ABNORMAL HIGH (ref 80.0–100.0)
Monocytes Absolute: 0.3 10*3/uL (ref 0.1–1.0)
Monocytes Relative: 2 %
Neutro Abs: 16.7 10*3/uL — ABNORMAL HIGH (ref 1.7–7.7)
Neutrophils Relative %: 93 %
Platelets: 126 10*3/uL — ABNORMAL LOW (ref 150–400)
RBC: 2.37 MIL/uL — ABNORMAL LOW (ref 4.22–5.81)
RDW: 16.4 % — ABNORMAL HIGH (ref 11.5–15.5)
WBC: 17.8 10*3/uL — ABNORMAL HIGH (ref 4.0–10.5)
nRBC: 0.2 % (ref 0.0–0.2)

## 2019-01-26 LAB — BASIC METABOLIC PANEL
Anion gap: 7 (ref 5–15)
BUN: 70 mg/dL — ABNORMAL HIGH (ref 8–23)
CHLORIDE: 120 mmol/L — AB (ref 98–111)
CO2: 21 mmol/L — ABNORMAL LOW (ref 22–32)
CREATININE: 2.03 mg/dL — AB (ref 0.61–1.24)
Calcium: 7.9 mg/dL — ABNORMAL LOW (ref 8.9–10.3)
GFR calc Af Amer: 36 mL/min — ABNORMAL LOW (ref >60)
GFR calc non Af Amer: 31 mL/min — ABNORMAL LOW (ref >60)
Glucose, Bld: 167 mg/dL — ABNORMAL HIGH (ref 70–99)
Potassium: 4.6 mmol/L (ref 3.5–5.1)
Sodium: 148 mmol/L — ABNORMAL HIGH (ref 135–145)

## 2019-01-26 LAB — PREPARE RBC (CROSSMATCH)

## 2019-01-26 LAB — ABO/RH: ABO/RH(D): AB POS

## 2019-01-26 LAB — VANCOMYCIN, TROUGH: Vancomycin Tr: 47 ug/mL (ref 15–20)

## 2019-01-26 NOTE — Progress Notes (Signed)
Pulmonary Critical Care Medicine [place]   PULMONARY CRITICAL CARE SERVICE  PROGRESS NOTE  Date of Service: 01/26/2019  Gabriel Jensen  TDV:[id number]  DOB: 1941-04-22   DOA: 01/21/2019  Referring Physician: Merton Border, MD  HPI: Gabriel Jensen is a 78 y.o. male seen for follow up of Acute on Chronic Respiratory Failure.  Currently on full support pressure assist control mode has been on 40% FiO2  Medications: Reviewed on Rounds  Physical Exam:  Vitals: Temperature 97.1 pulse 94 respiratory rate 21 blood pressure 143/71 saturation 99%  Ventilator Settings mode ventilation pressure assist control FiO2 40% tidal line 549 PEEP 5  . General: Comfortable at this time . Eyes: Grossly normal lids, irises & conjunctiva . ENT: grossly tongue is normal . Neck: no obvious mass . Cardiovascular: S1 S2 normal no gallop . Respiratory: Coarse rhonchi expansion is equal . Abdomen: soft . Skin: no rash seen on limited exam . Musculoskeletal: not rigid . Psychiatric:unable to assess . Neurologic: no seizure no involuntary movements         Lab Data:   Basic Metabolic Panel: Recent Labs  Lab 01/21/19 1702 01/23/19 1636 01/24/19 0902 01/25/19 0156 01/26/19 0216  NA 154* 152* 152* 150* 148*  K 5.1 5.5* 5.1 5.5* 4.6  CL 121* 121* 120* 121* 120*  CO2 31 24 24 23  21*  GLUCOSE 99 116* 125* 179* 167*  BUN 38* 61* 68* 71* 70*  CREATININE 1.04 1.80* 1.99* 2.14* 2.03*  CALCIUM 7.7* 7.4* 7.4* 7.6* 7.9*    ABG: Recent Labs  Lab 01/21/19 1746  PHART 7.310*  PCO2ART 61.6*  PO2ART 88.1  HCO3 30.1*  O2SAT 95.9    Liver Function Tests: Recent Labs  Lab 01/21/19 1702  AST 53*  ALT 60*  ALKPHOS 80  BILITOT 0.4  PROT 6.2*  ALBUMIN 1.5*   No results for input(s): LIPASE, AMYLASE in the last 168 hours. No results for input(s): AMMONIA in the last 168 hours.  CBC: Recent Labs  Lab 01/21/19 1702  01/25/19 0719 01/25/19 1304 01/25/19 2004 01/26/19 0216  01/26/19 0704  WBC 12.0*   < > 14.2* 15.9* 16.1* 17.8* 18.1*  NEUTROABS 10.0*  --   --   --   --  16.7*  --   HGB 9.2*   < > 7.4* 7.5* 7.0* 7.4* 7.5*  HCT 31.6*   < > 26.5* 26.7* 24.9* 25.0* 26.3*  MCV 101.9*   < > 106.9* 106.8* 106.0* 105.5* 104.4*  PLT PLATELET CLUMPS NOTED ON SMEAR, UNABLE TO ESTIMATE   < > 120* 120* 120* 126* 132*   < > = values in this interval not displayed.    Cardiac Enzymes: No results for input(s): CKTOTAL, CKMB, CKMBINDEX, TROPONINI in the last 168 hours.  BNP (last 3 results) No results for input(s): BNP in the last 8760 hours.  ProBNP (last 3 results) No results for input(s): PROBNP in the last 8760 hours.  Radiological Exams: Dg Chest Port 1 View  Result Date: 01/26/2019 CLINICAL DATA:  Pneumonia. EXAM: PORTABLE CHEST 1 VIEW COMPARISON:  01/23/2019 FINDINGS: Again noted is a tracheostomy tube and nasogastric tube. Nasogastric tube extends into the abdomen but the tip is beyond the image. Extensive bilateral parenchymal lung disease is again noted and airspace disease in the mid lungs has slightly progressed. Cardiac silhouette is obscured by the lung densities. Evidence of previous CABG procedure. Bibasilar densities are most compatible with pleural effusions. Negative for pneumothorax. IMPRESSION: Bilateral parenchymal lung disease has slightly  progressed. Findings are suggestive for pneumonia. Probable pleural effusions. Support apparatuses as described. Electronically Signed   By: Markus Daft M.D.   On: 01/26/2019 07:56    Assessment/Plan Active Problems:   Acute on chronic respiratory failure with hypoxia (HCC)   Hyponatremia   Bilateral pneumonia   Chronic atrial fibrillation   Hemothorax with pneumothorax, traumatic, sequela   Hypotension   1. Acute on chronic respiratory failure with hypoxia not weaning at this time remains on the ventilator full support will continue to titrate oxygen as tolerated 2. Hypernatremia clinically improving we will  continue to monitor 3. Bilateral pneumonia last chest x-ray still showing some infiltrate present we will continue to monitor has been treated with antibiotics 4. Chronic atrial fibrillation rate is controlled 5. Hemothorax with pneumothorax resolved continue with supportive care 6. Hypotension resolved   I have personally seen and evaluated the patient, evaluated laboratory and imaging results, formulated the assessment and plan and placed orders. The Patient requires high complexity decision making for assessment and support.  Case was discussed on Rounds with the Respiratory Therapy Staff  Allyne Gee, MD [facility] Pulmonary Critical Care Medicine Sleep Medicine

## 2019-01-27 DIAGNOSIS — S272XXS Traumatic hemopneumothorax, sequela: Secondary | ICD-10-CM | POA: Diagnosis not present

## 2019-01-27 DIAGNOSIS — J9621 Acute and chronic respiratory failure with hypoxia: Secondary | ICD-10-CM | POA: Diagnosis not present

## 2019-01-27 DIAGNOSIS — I482 Chronic atrial fibrillation, unspecified: Secondary | ICD-10-CM | POA: Diagnosis not present

## 2019-01-27 DIAGNOSIS — J189 Pneumonia, unspecified organism: Secondary | ICD-10-CM | POA: Diagnosis not present

## 2019-01-27 LAB — CBC
HCT: 27.3 % — ABNORMAL LOW (ref 39.0–52.0)
HCT: 25 % — ABNORMAL LOW (ref 39.0–52.0)
HCT: 30.6 % — ABNORMAL LOW (ref 39.0–52.0)
HCT: 27.2 % — ABNORMAL LOW (ref 39.0–52.0)
Hemoglobin: 7.9 g/dL — ABNORMAL LOW (ref 13.0–17.0)
Hemoglobin: 7.5 g/dL — ABNORMAL LOW (ref 13.0–17.0)
Hemoglobin: 8.4 g/dL — ABNORMAL LOW (ref 13.0–17.0)
Hemoglobin: 7.9 g/dL — ABNORMAL LOW (ref 13.0–17.0)
MCH: 30 pg (ref 26.0–34.0)
MCH: 31 pg (ref 26.0–34.0)
MCH: 29.3 pg (ref 26.0–34.0)
MCH: 30.4 pg (ref 26.0–34.0)
MCHC: 28.9 g/dL — ABNORMAL LOW (ref 30.0–36.0)
MCHC: 30 g/dL (ref 30.0–36.0)
MCHC: 27.5 g/dL — ABNORMAL LOW (ref 30.0–36.0)
MCHC: 29 g/dL — ABNORMAL LOW (ref 30.0–36.0)
MCV: 103.8 fL — ABNORMAL HIGH (ref 80.0–100.0)
MCV: 103.3 fL — ABNORMAL HIGH (ref 80.0–100.0)
MCV: 106.6 fL — ABNORMAL HIGH (ref 80.0–100.0)
MCV: 104.6 fL — ABNORMAL HIGH (ref 80.0–100.0)
NRBC: 0.4 % — AB (ref 0.0–0.2)
Platelets: 104 10*3/uL — ABNORMAL LOW (ref 150–400)
Platelets: 80 10*3/uL — ABNORMAL LOW (ref 150–400)
Platelets: 114 10*3/uL — ABNORMAL LOW (ref 150–400)
Platelets: 72 10*3/uL — ABNORMAL LOW (ref 150–400)
RBC: 2.63 MIL/uL — ABNORMAL LOW (ref 4.22–5.81)
RBC: 2.42 MIL/uL — ABNORMAL LOW (ref 4.22–5.81)
RBC: 2.87 MIL/uL — ABNORMAL LOW (ref 4.22–5.81)
RBC: 2.6 MIL/uL — AB (ref 4.22–5.81)
RDW: 16.7 % — ABNORMAL HIGH (ref 11.5–15.5)
RDW: 16.8 % — ABNORMAL HIGH (ref 11.5–15.5)
RDW: 16.8 % — AB (ref 11.5–15.5)
RDW: 16.7 % — ABNORMAL HIGH (ref 11.5–15.5)
WBC: 16.1 10*3/uL — ABNORMAL HIGH (ref 4.0–10.5)
WBC: 11.9 10*3/uL — ABNORMAL HIGH (ref 4.0–10.5)
WBC: 18.4 10*3/uL — ABNORMAL HIGH (ref 4.0–10.5)
WBC: 13.1 10*3/uL — ABNORMAL HIGH (ref 4.0–10.5)
nRBC: 0.3 % — ABNORMAL HIGH (ref 0.0–0.2)
nRBC: 0.3 % — ABNORMAL HIGH (ref 0.0–0.2)
nRBC: 0.2 % (ref 0.0–0.2)

## 2019-01-27 LAB — BASIC METABOLIC PANEL
Anion gap: 6 (ref 5–15)
BUN: 75 mg/dL — ABNORMAL HIGH (ref 8–23)
CO2: 21 mmol/L — ABNORMAL LOW (ref 22–32)
Calcium: 7.9 mg/dL — ABNORMAL LOW (ref 8.9–10.3)
Chloride: 122 mmol/L — ABNORMAL HIGH (ref 98–111)
Creatinine, Ser: 2.08 mg/dL — ABNORMAL HIGH (ref 0.61–1.24)
GFR calc Af Amer: 35 mL/min — ABNORMAL LOW (ref >60)
GFR calc non Af Amer: 30 mL/min — ABNORMAL LOW (ref >60)
Glucose, Bld: 173 mg/dL — ABNORMAL HIGH (ref 70–99)
Potassium: 4.9 mmol/L (ref 3.5–5.1)
Sodium: 149 mmol/L — ABNORMAL HIGH (ref 135–145)

## 2019-01-27 LAB — BPAM RBC
Blood Product Expiration Date: 202003052359
ISSUE DATE / TIME: 202002102339
UNIT TYPE AND RH: 8400

## 2019-01-27 LAB — TYPE AND SCREEN
ABO/RH(D): AB POS
ANTIBODY SCREEN: NEGATIVE
UNIT DIVISION: 0

## 2019-01-27 LAB — LACTIC ACID, PLASMA: Lactic Acid, Venous: 1.3 mmol/L (ref 0.5–1.9)

## 2019-01-27 LAB — VANCOMYCIN, TROUGH: Vancomycin Tr: 39 ug/mL (ref 15–20)

## 2019-01-27 NOTE — Progress Notes (Signed)
Pulmonary Critical Care Medicine [place]   PULMONARY CRITICAL CARE SERVICE  PROGRESS NOTE  Date of Service: 01/27/2019  Gabriel Jensen  JOI:[id number]  DOB: [birth year]   DOA: 01/21/2019  Referring Physician: Merton Border, MD  HPI: Gabriel Jensen is a 78 y.o. male seen for follow up of Acute on Chronic Respiratory Failure.  Patient is on pressure support mode was able to do about 4 hours yesterday  Medications: Reviewed on Rounds  Physical Exam:  Vitals: Temperature 97.3 pulse 63 respiratory rate 20 blood pressure 111/54 saturations 97%  Ventilator Settings mode ventilation pressure support FiO2 30% pressure 12 PEEP 5  . General: Comfortable at this time . Eyes: Grossly normal lids, irises & conjunctiva . ENT: grossly tongue is normal . Neck: no obvious mass . Cardiovascular: S1 S2 normal no gallop . Respiratory: Scattered rhonchi expansion is equal . Abdomen: soft . Skin: no rash seen on limited exam . Musculoskeletal: not rigid . Psychiatric:unable to assess . Neurologic: no seizure no involuntary movements         Lab Data:   Basic Metabolic Panel: Recent Labs  Lab 01/23/19 1636 01/24/19 0902 01/25/19 0156 01/26/19 0216 01/27/19 0246  NA 152* 152* 150* 148* 149*  K 5.5* 5.1 5.5* 4.6 4.9  CL 121* 120* 121* 120* 122*  CO2 24 24 23  21* 21*  GLUCOSE 116* 125* 179* 167* 173*  BUN 61* 68* 71* 70* 75*  CREATININE 1.80* 1.99* 2.14* 2.03* 2.08*  CALCIUM 7.4* 7.4* 7.6* 7.9* 7.9*    ABG: Recent Labs  Lab 01/21/19 1746  PHART 7.310*  PCO2ART 61.6*  PO2ART 88.1  HCO3 30.1*  O2SAT 95.9    Liver Function Tests: Recent Labs  Lab 01/21/19 1702  AST 53*  ALT 60*  ALKPHOS 80  BILITOT 0.4  PROT 6.2*  ALBUMIN 1.5*   No results for input(s): LIPASE, AMYLASE in the last 168 hours. No results for input(s): AMMONIA in the last 168 hours.  CBC: Recent Labs  Lab 01/21/19 1702  01/26/19 0216  01/26/19 1338 01/26/19 2041 01/27/19 0246  01/27/19 0849 01/27/19 1304  WBC 12.0*   < > 17.8*   < > 12.7* 17.3* 16.1* 11.9* 18.4*  NEUTROABS 10.0*  --  16.7*  --   --   --   --   --   --   HGB 9.2*   < > 7.4*   < > 7.0* 7.3* 7.9* 7.5* 8.4*  HCT 31.6*   < > 25.0*   < > 23.9* 25.9* 27.3* 25.0* 30.6*  MCV 101.9*   < > 105.5*   < > 104.4* 105.7* 103.8* 103.3* 106.6*  PLT PLATELET CLUMPS NOTED ON SMEAR, UNABLE TO ESTIMATE   < > 126*   < > 92* 109* 104* 80* 114*   < > = values in this interval not displayed.    Cardiac Enzymes: No results for input(s): CKTOTAL, CKMB, CKMBINDEX, TROPONINI in the last 168 hours.  BNP (last 3 results) No results for input(s): BNP in the last 8760 hours.  ProBNP (last 3 results) No results for input(s): PROBNP in the last 8760 hours.  Radiological Exams: Dg Chest Port 1 View  Result Date: 01/26/2019 CLINICAL DATA:  Pneumonia. EXAM: PORTABLE CHEST 1 VIEW COMPARISON:  01/23/2019 FINDINGS: Again noted is a tracheostomy tube and nasogastric tube. Nasogastric tube extends into the abdomen but the tip is beyond the image. Extensive bilateral parenchymal lung disease is again noted and airspace disease in the mid lungs has  slightly progressed. Cardiac silhouette is obscured by the lung densities. Evidence of previous CABG procedure. Bibasilar densities are most compatible with pleural effusions. Negative for pneumothorax. IMPRESSION: Bilateral parenchymal lung disease has slightly progressed. Findings are suggestive for pneumonia. Probable pleural effusions. Support apparatuses as described. Electronically Signed   By: Markus Daft M.D.   On: 01/26/2019 07:56    Assessment/Plan Active Problems:   Acute on chronic respiratory failure with hypoxia (HCC)   Hyponatremia   Bilateral pneumonia   Chronic atrial fibrillation   Hemothorax with pneumothorax, traumatic, sequela   Hypotension   1. Acute on chronic respiratory failure with hypoxia we will continue with pressure support mode titrate oxygen as  tolerated 2. Hyponatremia clinically following labs 3. Bilateral pneumonia treated patient still has some mild bilateral parenchymal disease based on yesterday's film we will continue to follow clinically 4. Chronic atrial fibrillation rate controlled 5. Hemothorax pneumothorax negative findings on the chest film   I have personally seen and evaluated the patient, evaluated laboratory and imaging results, formulated the assessment and plan and placed orders. The Patient requires high complexity decision making for assessment and support.  Case was discussed on Rounds with the Respiratory Therapy Staff  Allyne Gee, MD [facility] Pulmonary Critical Care Medicine Sleep Medicine

## 2019-01-28 DIAGNOSIS — J189 Pneumonia, unspecified organism: Secondary | ICD-10-CM | POA: Diagnosis not present

## 2019-01-28 DIAGNOSIS — J9621 Acute and chronic respiratory failure with hypoxia: Secondary | ICD-10-CM | POA: Diagnosis not present

## 2019-01-28 DIAGNOSIS — S272XXS Traumatic hemopneumothorax, sequela: Secondary | ICD-10-CM | POA: Diagnosis not present

## 2019-01-28 DIAGNOSIS — I482 Chronic atrial fibrillation, unspecified: Secondary | ICD-10-CM | POA: Diagnosis not present

## 2019-01-28 LAB — CBC
HCT: 26.8 % — ABNORMAL LOW (ref 39.0–52.0)
Hemoglobin: 7.9 g/dL — ABNORMAL LOW (ref 13.0–17.0)
MCH: 30.6 pg (ref 26.0–34.0)
MCHC: 29.5 g/dL — ABNORMAL LOW (ref 30.0–36.0)
MCV: 103.9 fL — ABNORMAL HIGH (ref 80.0–100.0)
Platelets: 65 10*3/uL — ABNORMAL LOW (ref 150–400)
RBC: 2.58 MIL/uL — ABNORMAL LOW (ref 4.22–5.81)
RDW: 16.6 % — ABNORMAL HIGH (ref 11.5–15.5)
WBC: 11.3 10*3/uL — ABNORMAL HIGH (ref 4.0–10.5)
nRBC: 0.2 % (ref 0.0–0.2)

## 2019-01-28 LAB — BASIC METABOLIC PANEL
ANION GAP: 8 (ref 5–15)
BUN: 86 mg/dL — ABNORMAL HIGH (ref 8–23)
CO2: 20 mmol/L — ABNORMAL LOW (ref 22–32)
Calcium: 7.7 mg/dL — ABNORMAL LOW (ref 8.9–10.3)
Chloride: 118 mmol/L — ABNORMAL HIGH (ref 98–111)
Creatinine, Ser: 2.52 mg/dL — ABNORMAL HIGH (ref 0.61–1.24)
GFR calc Af Amer: 27 mL/min — ABNORMAL LOW (ref >60)
GFR calc non Af Amer: 24 mL/min — ABNORMAL LOW (ref >60)
Glucose, Bld: 159 mg/dL — ABNORMAL HIGH (ref 70–99)
Potassium: 4.9 mmol/L (ref 3.5–5.1)
SODIUM: 146 mmol/L — AB (ref 135–145)

## 2019-01-28 LAB — CULTURE, BLOOD (ROUTINE X 2): CULTURE: NO GROWTH

## 2019-01-28 LAB — VANCOMYCIN, TROUGH: Vancomycin Tr: 33 ug/mL (ref 15–20)

## 2019-01-28 LAB — MAGNESIUM: MAGNESIUM: 2.4 mg/dL (ref 1.7–2.4)

## 2019-01-28 NOTE — Progress Notes (Addendum)
Pulmonary Critical Care Medicine [place]   PULMONARY CRITICAL CARE SERVICE  PROGRESS NOTE  Date of Service: 01/28/2019  Gabriel Jensen  WVP:[id number]  DOB: 1941-01-29   DOA: 01/21/2019  Referring Physician: Merton Border, MD  HPI: Gabriel Jensen is a 78 y.o. male seen for follow up of Acute on Chronic Respiratory Failure.  Patient continues to rest on pressure control assist mode.  FiO2 is currently 40%.  Patient was unable to wean today and is resting on the vent at this time on pressure control.  Medications: Reviewed on Rounds  Physical Exam:  Vitals: Pulse 64 respirations 21 BP 120/66 O2 sat 89% temp 96.6  Ventilator Settings ventilator mode AC PC rate of 12 IP 24 PEEP 5 FiO2 40%  . General: Comfortable at this time . Eyes: Grossly normal lids, irises & conjunctiva . ENT: grossly tongue is normal . Neck: no obvious mass . Cardiovascular: S1 S2 normal no gallop . Respiratory: Scattered rhonchi expansion equal . Abdomen: soft . Skin: no rash seen on limited exam . Musculoskeletal: not rigid . Psychiatric:unable to assess . Neurologic: no seizure no involuntary movements         Lab Data:   Basic Metabolic Panel: Recent Labs  Lab 01/24/19 0902 01/25/19 0156 01/26/19 0216 01/27/19 0246 01/28/19 0506  NA 152* 150* 148* 149* 146*  K 5.1 5.5* 4.6 4.9 4.9  CL 120* 121* 120* 122* 118*  CO2 24 23 21* 21* 20*  GLUCOSE 125* 179* 167* 173* 159*  BUN 68* 71* 70* 75* 86*  CREATININE 1.99* 2.14* 2.03* 2.08* 2.52*  CALCIUM 7.4* 7.6* 7.9* 7.9* 7.7*  MG  --   --   --   --  2.4    ABG: No results for input(s): PHART, PCO2ART, PO2ART, HCO3, O2SAT in the last 168 hours.  Liver Function Tests: No results for input(s): AST, ALT, ALKPHOS, BILITOT, PROT, ALBUMIN in the last 168 hours. No results for input(s): LIPASE, AMYLASE in the last 168 hours. No results for input(s): AMMONIA in the last 168 hours.  CBC: Recent Labs  Lab 01/26/19 0216   01/27/19 0246 01/27/19 0849 01/27/19 1304 01/27/19 1930 01/28/19 0506  WBC 17.8*   < > 16.1* 11.9* 18.4* 13.1* 11.3*  NEUTROABS 16.7*  --   --   --   --   --   --   HGB 7.4*   < > 7.9* 7.5* 8.4* 7.9* 7.9*  HCT 25.0*   < > 27.3* 25.0* 30.6* 27.2* 26.8*  MCV 105.5*   < > 103.8* 103.3* 106.6* 104.6* 103.9*  PLT 126*   < > 104* 80* 114* 72* 65*   < > = values in this interval not displayed.    Cardiac Enzymes: No results for input(s): CKTOTAL, CKMB, CKMBINDEX, TROPONINI in the last 168 hours.  BNP (last 3 results) No results for input(s): BNP in the last 8760 hours.  ProBNP (last 3 results) No results for input(s): PROBNP in the last 8760 hours.  Radiological Exams: No results found.  Assessment/Plan Active Problems:   Acute on chronic respiratory failure with hypoxia (HCC)   Hyponatremia   Bilateral pneumonia   Chronic atrial fibrillation   Hemothorax with pneumothorax, traumatic, sequela   Hypotension   1. Acute on tonic respiratory failure with hypoxia continue pressure support mode titrate oxygen as tolerated.  Continue aggressive pulmonary toilet. 2. Hyponatremia clinically resolved, continue to follow labs. 3. Bilateral pneumonia treated continue to follow 4. Chronic atrial fibrillation rate trolled 5.  Hemothorax pneumothorax negative findings on chest film   I have personally seen and evaluated the patient, evaluated laboratory and imaging results, formulated the assessment and plan and placed orders. The Patient requires high complexity decision making for assessment and support.  Case was discussed on Rounds with the Respiratory Therapy Staff  Allyne Gee, MD [facility] Pulmonary Critical Care Medicine Sleep Medicine

## 2019-01-29 ENCOUNTER — Other Ambulatory Visit (HOSPITAL_COMMUNITY): Payer: Non-veteran care

## 2019-01-29 DIAGNOSIS — J9621 Acute and chronic respiratory failure with hypoxia: Secondary | ICD-10-CM | POA: Diagnosis not present

## 2019-01-29 DIAGNOSIS — I482 Chronic atrial fibrillation, unspecified: Secondary | ICD-10-CM | POA: Diagnosis not present

## 2019-01-29 DIAGNOSIS — J189 Pneumonia, unspecified organism: Secondary | ICD-10-CM | POA: Diagnosis not present

## 2019-01-29 DIAGNOSIS — S272XXS Traumatic hemopneumothorax, sequela: Secondary | ICD-10-CM | POA: Diagnosis not present

## 2019-01-29 LAB — CBC
HCT: 25.9 % — ABNORMAL LOW (ref 39.0–52.0)
Hemoglobin: 7.4 g/dL — ABNORMAL LOW (ref 13.0–17.0)
MCH: 29.1 pg (ref 26.0–34.0)
MCHC: 28.6 g/dL — ABNORMAL LOW (ref 30.0–36.0)
MCV: 102 fL — ABNORMAL HIGH (ref 80.0–100.0)
Platelets: 67 K/uL — ABNORMAL LOW (ref 150–400)
RBC: 2.54 MIL/uL — ABNORMAL LOW (ref 4.22–5.81)
RDW: 15.9 % — ABNORMAL HIGH (ref 11.5–15.5)
WBC: 9.8 K/uL (ref 4.0–10.5)
nRBC: 0 % (ref 0.0–0.2)

## 2019-01-29 NOTE — Progress Notes (Addendum)
Pulmonary Critical Care Medicine [place]   PULMONARY CRITICAL CARE SERVICE  PROGRESS NOTE  Date of Service: 01/29/2019  Loi Rennaker  LNL:[id number]  DOB: 1941-07-26   DOA: 01/21/2019  Referring Physician: Merton Border, MD  HPI: Gabriel Jensen is a 78 y.o. male seen for follow up of Acute on Chronic Respiratory Failure.  Patient has an 8-hour goal today on pressure support 12/5 FiO2 40%.  Currently doing well with minimal secretions, will most likely meet goal today.  Medications: Reviewed on Rounds  Physical Exam:  Vitals: Pulse 60 respirations 12 BP 104/57 O2 sat 99% temp 96.2  Ventilator Settings ventilator mode currently pressure support 12/5 FiO2 40% Patient will rest on ventilator mode AC PC rate of 12 IP 24 PEEP of 5 FiO2 35%.  . General: Comfortable at this time . Eyes: Grossly normal lids, irises & conjunctiva . ENT: grossly tongue is normal . Neck: no obvious mass . Cardiovascular: S1 S2 normal no gallop . Respiratory: Scattered rhonchi . Abdomen: soft . Skin: no rash seen on limited exam . Musculoskeletal: not rigid . Psychiatric:unable to assess . Neurologic: no seizure no involuntary movements         Lab Data:   Basic Metabolic Panel: Recent Labs  Lab 01/24/19 0902 01/25/19 0156 01/26/19 0216 01/27/19 0246 01/28/19 0506  NA 152* 150* 148* 149* 146*  K 5.1 5.5* 4.6 4.9 4.9  CL 120* 121* 120* 122* 118*  CO2 24 23 21* 21* 20*  GLUCOSE 125* 179* 167* 173* 159*  BUN 68* 71* 70* 75* 86*  CREATININE 1.99* 2.14* 2.03* 2.08* 2.52*  CALCIUM 7.4* 7.6* 7.9* 7.9* 7.7*  MG  --   --   --   --  2.4    ABG: No results for input(s): PHART, PCO2ART, PO2ART, HCO3, O2SAT in the last 168 hours.  Liver Function Tests: No results for input(s): AST, ALT, ALKPHOS, BILITOT, PROT, ALBUMIN in the last 168 hours. No results for input(s): LIPASE, AMYLASE in the last 168 hours. No results for input(s): AMMONIA in the last 168 hours.  CBC: Recent  Labs  Lab 01/26/19 0216  01/27/19 0849 01/27/19 1304 01/27/19 1930 01/28/19 0506 01/29/19 0613  WBC 17.8*   < > 11.9* 18.4* 13.1* 11.3* 9.8  NEUTROABS 16.7*  --   --   --   --   --   --   HGB 7.4*   < > 7.5* 8.4* 7.9* 7.9* 7.4*  HCT 25.0*   < > 25.0* 30.6* 27.2* 26.8* 25.9*  MCV 105.5*   < > 103.3* 106.6* 104.6* 103.9* 102.0*  PLT 126*   < > 80* 114* 72* 65* 67*   < > = values in this interval not displayed.    Cardiac Enzymes: No results for input(s): CKTOTAL, CKMB, CKMBINDEX, TROPONINI in the last 168 hours.  BNP (last 3 results) No results for input(s): BNP in the last 8760 hours.  ProBNP (last 3 results) No results for input(s): PROBNP in the last 8760 hours.  Radiological Exams: US Renal  Result Date: 01/29/2019 CLINICAL DATA:  Acute kidney injury. EXAM: RENAL / URINARY TRACT ULTRASOUND COMPLETE COMPARISON:  None. FINDINGS: Right Kidney: Renal measurements: 10.2 x 5.6 x 5.2 cm = volume: 155 mL . Echogenicity within normal limits. No mass or hydronephrosis visualized. Left Kidney: Renal measurements: 10.6 x 6.2 x 5.8 cm = volume: 101 ML. 2.2 cm simple cyst is seen in midpole. Echogenicity within normal limits. No mass or hydronephrosis visualized. Bladder: Decompressed secondary  to Foley catheter.  Ascites is noted. IMPRESSION: No significant renal abnormality seen. Mild ascites is noted. Bladder decompressed secondary to Foley catheter. Electronically Signed   By: Marijo Conception, M.D.   On: 01/29/2019 16:22    Assessment/Plan Active Problems:   Acute on chronic respiratory failure with hypoxia (HCC)   Hyponatremia   Bilateral pneumonia   Chronic atrial fibrillation   Hemothorax with pneumothorax, traumatic, sequela   Hypotension   1. Acute on chronic respiratory failure with hypoxia continue pressure support mode and titrate oxygen as tolerated.  Continue aggressive pulmonary toilet.  Goal today on pressure support 8 hours continue to increase as  tolerated. 2. Hyponatremia clinically resolved continue to follow labs 3. Bilateral pneumonia treated continue to follow 4. Chronic atrial fibrillation rate controlled 5. Hemothorax pneumothorax negative findings on chest film   I have personally seen and evaluated the patient, evaluated laboratory and imaging results, formulated the assessment and plan and placed orders. The Patient requires high complexity decision making for assessment and support.  Case was discussed on Rounds with the Respiratory Therapy Staff  Allyne Gee, MD [facility] Pulmonary Critical Care Medicine Sleep Medicine

## 2019-01-29 NOTE — Progress Notes (Signed)
  Echocardiogram 2D Echocardiogram has been performed.  Darlina Sicilian M 01/29/2019, 9:35 AM

## 2019-01-30 ENCOUNTER — Other Ambulatory Visit (HOSPITAL_COMMUNITY): Payer: Non-veteran care

## 2019-01-30 DIAGNOSIS — J189 Pneumonia, unspecified organism: Secondary | ICD-10-CM | POA: Diagnosis not present

## 2019-01-30 DIAGNOSIS — J9621 Acute and chronic respiratory failure with hypoxia: Secondary | ICD-10-CM | POA: Diagnosis not present

## 2019-01-30 DIAGNOSIS — S272XXS Traumatic hemopneumothorax, sequela: Secondary | ICD-10-CM | POA: Diagnosis not present

## 2019-01-30 DIAGNOSIS — I482 Chronic atrial fibrillation, unspecified: Secondary | ICD-10-CM | POA: Diagnosis not present

## 2019-01-30 LAB — CBC
HCT: 22.7 % — ABNORMAL LOW (ref 39.0–52.0)
Hemoglobin: 6.9 g/dL — CL (ref 13.0–17.0)
MCH: 30.9 pg (ref 26.0–34.0)
MCHC: 30.4 g/dL (ref 30.0–36.0)
MCV: 101.8 fL — ABNORMAL HIGH (ref 80.0–100.0)
Platelets: 65 10*3/uL — ABNORMAL LOW (ref 150–400)
RBC: 2.23 MIL/uL — ABNORMAL LOW (ref 4.22–5.81)
RDW: 15.9 % — ABNORMAL HIGH (ref 11.5–15.5)
WBC: 8.1 10*3/uL (ref 4.0–10.5)
nRBC: 0 % (ref 0.0–0.2)

## 2019-01-30 LAB — COMPREHENSIVE METABOLIC PANEL
ALBUMIN: 1.6 g/dL — AB (ref 3.5–5.0)
ALT: 27 U/L (ref 0–44)
AST: 22 U/L (ref 15–41)
Alkaline Phosphatase: 57 U/L (ref 38–126)
Anion gap: 8 (ref 5–15)
BUN: 103 mg/dL — ABNORMAL HIGH (ref 8–23)
CO2: 18 mmol/L — ABNORMAL LOW (ref 22–32)
Calcium: 7.3 mg/dL — ABNORMAL LOW (ref 8.9–10.3)
Chloride: 119 mmol/L — ABNORMAL HIGH (ref 98–111)
Creatinine, Ser: 3.04 mg/dL — ABNORMAL HIGH (ref 0.61–1.24)
GFR calc Af Amer: 22 mL/min — ABNORMAL LOW (ref >60)
GFR calc non Af Amer: 19 mL/min — ABNORMAL LOW (ref >60)
GLUCOSE: 148 mg/dL — AB (ref 70–99)
POTASSIUM: 5.2 mmol/L — AB (ref 3.5–5.1)
SODIUM: 145 mmol/L (ref 135–145)
Total Bilirubin: 0.6 mg/dL (ref 0.3–1.2)
Total Protein: 5.4 g/dL — ABNORMAL LOW (ref 6.5–8.1)

## 2019-01-30 LAB — PREPARE RBC (CROSSMATCH)

## 2019-01-30 LAB — LACTIC ACID, PLASMA: Lactic Acid, Venous: 1 mmol/L (ref 0.5–1.9)

## 2019-01-30 LAB — MAGNESIUM: Magnesium: 2.2 mg/dL (ref 1.7–2.4)

## 2019-01-30 NOTE — Progress Notes (Addendum)
Pulmonary Critical Care Medicine [place]   PULMONARY CRITICAL CARE SERVICE  PROGRESS NOTE  Date of Service: 01/30/2019  Bucky Grigg  EHU:[id number]  DOB: [birth year]   DOA: 01/21/2019  Referring Physician: Merton Border, MD  HPI: Gabriel Jensen is a 78 y.o. male seen for follow up of Acute on Chronic Respiratory Failure.  Patient's temp found to be low this morning with hemoglobin of 6.9.  Patient received 1 unit of packed red cells.  So RT has rested patient on vent today while receiving blood.  Medications: Reviewed on Rounds  Physical Exam:  Vitals: Pulse 68 respirations 24 BP 111/53 O2 sat 96% temp 98.5  Ventilator Settings ventilator mode AC PC rate of 12 IP 24 FiO2 35% PEEP of 5  . General: Comfortable at this time . Eyes: Grossly normal lids, irises & conjunctiva . ENT: grossly tongue is normal . Neck: no obvious mass . Cardiovascular: S1 S2 normal no gallop . Respiratory: Scattered rhonchi . Abdomen: soft . Skin: no rash seen on limited exam . Musculoskeletal: not rigid . Psychiatric:unable to assess . Neurologic: no seizure no involuntary movements         Lab Data:   Basic Metabolic Panel: Recent Labs  Lab 01/25/19 0156 01/26/19 0216 01/27/19 0246 01/28/19 0506 01/30/19 0420  NA 150* 148* 149* 146* 145  K 5.5* 4.6 4.9 4.9 5.2*  CL 121* 120* 122* 118* 119*  CO2 23 21* 21* 20* 18*  GLUCOSE 179* 167* 173* 159* 148*  BUN 71* 70* 75* 86* 103*  CREATININE 2.14* 2.03* 2.08* 2.52* 3.04*  CALCIUM 7.6* 7.9* 7.9* 7.7* 7.3*  MG  --   --   --  2.4 2.2    ABG: No results for input(s): PHART, PCO2ART, PO2ART, HCO3, O2SAT in the last 168 hours.  Liver Function Tests: Recent Labs  Lab 01/30/19 0420  AST 22  ALT 27  ALKPHOS 57  BILITOT 0.6  PROT 5.4*  ALBUMIN 1.6*   No results for input(s): LIPASE, AMYLASE in the last 168 hours. No results for input(s): AMMONIA in the last 168 hours.  CBC: Recent Labs  Lab 01/26/19 0216   01/27/19 1304 01/27/19 1930 01/28/19 0506 01/29/19 0613 01/30/19 0420  WBC 17.8*   < > 18.4* 13.1* 11.3* 9.8 8.1  NEUTROABS 16.7*  --   --   --   --   --   --   HGB 7.4*   < > 8.4* 7.9* 7.9* 7.4* 6.9*  HCT 25.0*   < > 30.6* 27.2* 26.8* 25.9* 22.7*  MCV 105.5*   < > 106.6* 104.6* 103.9* 102.0* 101.8*  PLT 126*   < > 114* 72* 65* 67* 65*   < > = values in this interval not displayed.    Cardiac Enzymes: No results for input(s): CKTOTAL, CKMB, CKMBINDEX, TROPONINI in the last 168 hours.  BNP (last 3 results) No results for input(s): BNP in the last 8760 hours.  ProBNP (last 3 results) No results for input(s): PROBNP in the last 8760 hours.  Radiological Exams: US Renal  Result Date: 01/29/2019 CLINICAL DATA:  Acute kidney injury. EXAM: RENAL / URINARY TRACT ULTRASOUND COMPLETE COMPARISON:  None. FINDINGS: Right Kidney: Renal measurements: 10.2 x 5.6 x 5.2 cm = volume: 155 mL . Echogenicity within normal limits. No mass or hydronephrosis visualized. Left Kidney: Renal measurements: 10.6 x 6.2 x 5.8 cm = volume: 101 ML. 2.2 cm simple cyst is seen in midpole. Echogenicity within normal limits. No mass or  hydronephrosis visualized. Bladder: Decompressed secondary to Foley catheter.  Ascites is noted. IMPRESSION: No significant renal abnormality seen. Mild ascites is noted. Bladder decompressed secondary to Foley catheter. Electronically Signed   By: Marijo Conception, M.D.   On: 01/29/2019 16:22   Dg Chest Port 1 View  Result Date: 01/30/2019 CLINICAL DATA:  Pleural effusion EXAM: PORTABLE CHEST 1 VIEW COMPARISON:  Four days ago FINDINGS: Tracheostomy tube remains well seated. The orogastric tube reaches the stomach. Diffuse interstitial and airspace opacity with small pleural effusions. There is consolidation and pleural effusions by abdominal CT 01/23/2019. Stable cardiomegaly. IMPRESSION: Bilateral pneumonia and pleural effusion without interval change. Electronically Signed   By: Monte Fantasia M.D.   On: 01/30/2019 10:43    Assessment/Plan Active Problems:   Acute on chronic respiratory failure with hypoxia (HCC)   Hyponatremia   Bilateral pneumonia   Chronic atrial fibrillation   Hemothorax with pneumothorax, traumatic, sequela   Hypotension   1. Acute on chronic respiratory failure with hypoxia continue pressure support mode and titrate oxygen as tolerated.  Continue aggressive pulmonary toilet and supportive measures. 2. Hyponatremia clinically resolved continue to follow labs 3. Bilateral pneumonia treated continue to follow 4. Chronic atrial fibrillation rate controlled 5. Hemothorax pneumothorax negative findings on chest film   I have personally seen and evaluated the patient, evaluated laboratory and imaging results, formulated the assessment and plan and placed orders. The Patient requires high complexity decision making for assessment and support.  Case was discussed on Rounds with the Respiratory Therapy Staff  Allyne Gee, MD [facility] Pulmonary Critical Care Medicine Sleep Medicine

## 2019-01-31 DIAGNOSIS — J9621 Acute and chronic respiratory failure with hypoxia: Secondary | ICD-10-CM | POA: Diagnosis not present

## 2019-01-31 DIAGNOSIS — J189 Pneumonia, unspecified organism: Secondary | ICD-10-CM | POA: Diagnosis not present

## 2019-01-31 DIAGNOSIS — S272XXS Traumatic hemopneumothorax, sequela: Secondary | ICD-10-CM | POA: Diagnosis not present

## 2019-01-31 DIAGNOSIS — I482 Chronic atrial fibrillation, unspecified: Secondary | ICD-10-CM | POA: Diagnosis not present

## 2019-01-31 LAB — RENAL FUNCTION PANEL
Albumin: 1.7 g/dL — ABNORMAL LOW (ref 3.5–5.0)
Albumin: 1.9 g/dL — ABNORMAL LOW (ref 3.5–5.0)
Anion gap: 6 (ref 5–15)
Anion gap: 8 (ref 5–15)
BUN: 117 mg/dL — ABNORMAL HIGH (ref 8–23)
BUN: 119 mg/dL — AB (ref 8–23)
CO2: 19 mmol/L — AB (ref 22–32)
CO2: 18 mmol/L — ABNORMAL LOW (ref 22–32)
Calcium: 7.2 mg/dL — ABNORMAL LOW (ref 8.9–10.3)
Calcium: 7.3 mg/dL — ABNORMAL LOW (ref 8.9–10.3)
Chloride: 119 mmol/L — ABNORMAL HIGH (ref 98–111)
Chloride: 115 mmol/L — ABNORMAL HIGH (ref 98–111)
Creatinine, Ser: 3.13 mg/dL — ABNORMAL HIGH (ref 0.61–1.24)
Creatinine, Ser: 3.16 mg/dL — ABNORMAL HIGH (ref 0.61–1.24)
GFR calc Af Amer: 21 mL/min — ABNORMAL LOW (ref >60)
GFR calc Af Amer: 21 mL/min — ABNORMAL LOW (ref >60)
GFR calc non Af Amer: 18 mL/min — ABNORMAL LOW (ref >60)
GFR calc non Af Amer: 18 mL/min — ABNORMAL LOW (ref >60)
Glucose, Bld: 177 mg/dL — ABNORMAL HIGH (ref 70–99)
Glucose, Bld: 168 mg/dL — ABNORMAL HIGH (ref 70–99)
POTASSIUM: 5.1 mmol/L (ref 3.5–5.1)
Phosphorus: 6.5 mg/dL — ABNORMAL HIGH (ref 2.5–4.6)
Phosphorus: 6.7 mg/dL — ABNORMAL HIGH (ref 2.5–4.6)
Potassium: 5.1 mmol/L (ref 3.5–5.1)
Sodium: 144 mmol/L (ref 135–145)
Sodium: 141 mmol/L (ref 135–145)

## 2019-01-31 LAB — CBC
HCT: 23.5 % — ABNORMAL LOW (ref 39.0–52.0)
HCT: 23.3 % — ABNORMAL LOW (ref 39.0–52.0)
Hemoglobin: 7.2 g/dL — ABNORMAL LOW (ref 13.0–17.0)
Hemoglobin: 7.1 g/dL — ABNORMAL LOW (ref 13.0–17.0)
MCH: 30.5 pg (ref 26.0–34.0)
MCH: 30.2 pg (ref 26.0–34.0)
MCHC: 30.6 g/dL (ref 30.0–36.0)
MCHC: 30.5 g/dL (ref 30.0–36.0)
MCV: 99.6 fL (ref 80.0–100.0)
MCV: 99.1 fL (ref 80.0–100.0)
NRBC: 0.3 % — AB (ref 0.0–0.2)
PLATELETS: 55 10*3/uL — AB (ref 150–400)
Platelets: 58 10*3/uL — ABNORMAL LOW (ref 150–400)
RBC: 2.36 MIL/uL — ABNORMAL LOW (ref 4.22–5.81)
RBC: 2.35 MIL/uL — ABNORMAL LOW (ref 4.22–5.81)
RDW: 16.7 % — AB (ref 11.5–15.5)
RDW: 16.5 % — ABNORMAL HIGH (ref 11.5–15.5)
WBC: 7.7 10*3/uL (ref 4.0–10.5)
WBC: 7.7 10*3/uL (ref 4.0–10.5)
nRBC: 0 % (ref 0.0–0.2)

## 2019-01-31 LAB — BPAM RBC
BLOOD PRODUCT EXPIRATION DATE: 202003062359
ISSUE DATE / TIME: 202002141019
Unit Type and Rh: 8400

## 2019-01-31 LAB — VANCOMYCIN, TROUGH: Vancomycin Tr: 27 ug/mL (ref 15–20)

## 2019-01-31 LAB — TYPE AND SCREEN
ABO/RH(D): AB POS
Antibody Screen: NEGATIVE
Unit division: 0

## 2019-01-31 NOTE — Progress Notes (Addendum)
Pulmonary Critical Care Medicine [place]   PULMONARY CRITICAL CARE SERVICE  PROGRESS NOTE  Date of Service: 01/31/2019  Equan Cogbill  XNA:[id number]  DOB: 1941-06-01   DOA: 01/21/2019  Referring Physician: Merton Border, MD  HPI: Gabriel Jensen is a 78 y.o. male seen for follow up of Acute on Chronic Respiratory Failure.  Patient continues to rest on ventilator.  Requiring FiO2 of 35%.  Medications: Reviewed on Rounds  Physical Exam:  Vitals: Pulse 65 respirations 18 BP 124/58 O2 sat 97% temp 97.8  Ventilator Settings ventilator mode AC VC rate of 12 IP 24 FiO2 35% PEEP 5  . General: Comfortable at this time . Eyes: Grossly normal lids, irises & conjunctiva . ENT: grossly tongue is normal . Neck: no obvious mass . Cardiovascular: S1 S2 normal no gallop . Respiratory: Scattered rhonchi . Abdomen: soft . Skin: no rash seen on limited exam . Musculoskeletal: not rigid . Psychiatric:unable to assess . Neurologic: no seizure no involuntary movements         Lab Data:   Basic Metabolic Panel: Recent Labs  Lab 01/27/19 0246 01/28/19 0506 01/30/19 0420 01/31/19 0848 01/31/19 1433  NA 149* 146* 145 144 141  K 4.9 4.9 5.2* 5.1 5.1  CL 122* 118* 119* 119* 115*  CO2 21* 20* 18* 19* 18*  GLUCOSE 173* 159* 148* 177* 168*  BUN 75* 86* 103* 117* 119*  CREATININE 2.08* 2.52* 3.04* 3.13* 3.16*  CALCIUM 7.9* 7.7* 7.3* 7.2* 7.3*  MG  --  2.4 2.2  --   --   PHOS  --   --   --  6.5* 6.7*    ABG: No results for input(s): PHART, PCO2ART, PO2ART, HCO3, O2SAT in the last 168 hours.  Liver Function Tests: Recent Labs  Lab 01/30/19 0420 01/31/19 0848 01/31/19 1433  AST 22  --   --   ALT 27  --   --   ALKPHOS 57  --   --   BILITOT 0.6  --   --   PROT 5.4*  --   --   ALBUMIN 1.6* 1.7* 1.9*   No results for input(s): LIPASE, AMYLASE in the last 168 hours. No results for input(s): AMMONIA in the last 168 hours.  CBC: Recent Labs  Lab 01/26/19 0216   01/28/19 0506 01/29/19 0613 01/30/19 0420 01/31/19 0848 01/31/19 1433  WBC 17.8*   < > 11.3* 9.8 8.1 7.7 7.7  NEUTROABS 16.7*  --   --   --   --   --   --   HGB 7.4*   < > 7.9* 7.4* 6.9* 7.2* 7.1*  HCT 25.0*   < > 26.8* 25.9* 22.7* 23.5* 23.3*  MCV 105.5*   < > 103.9* 102.0* 101.8* 99.6 99.1  PLT 126*   < > 65* 67* 65* 55* 58*   < > = values in this interval not displayed.    Cardiac Enzymes: No results for input(s): CKTOTAL, CKMB, CKMBINDEX, TROPONINI in the last 168 hours.  BNP (last 3 results) No results for input(s): BNP in the last 8760 hours.  ProBNP (last 3 results) No results for input(s): PROBNP in the last 8760 hours.  Radiological Exams: Dg Chest Port 1 View  Result Date: 01/30/2019 CLINICAL DATA:  Pleural effusion EXAM: PORTABLE CHEST 1 VIEW COMPARISON:  Four days ago FINDINGS: Tracheostomy tube remains well seated. The orogastric tube reaches the stomach. Diffuse interstitial and airspace opacity with small pleural effusions. There is consolidation and pleural  effusions by abdominal CT 01/23/2019. Stable cardiomegaly. IMPRESSION: Bilateral pneumonia and pleural effusion without interval change. Electronically Signed   By: Monte Fantasia M.D.   On: 01/30/2019 10:43    Assessment/Plan Active Problems:   Acute on chronic respiratory failure with hypoxia (HCC)   Hyponatremia   Bilateral pneumonia   Chronic atrial fibrillation   Hemothorax with pneumothorax, traumatic, sequela   Hypotension   1. Acute chronic respiratory failure with hypoxia continue pressure support mode and titrate oxygen as tolerated.  Continue aggressive pulmonary toilet and supportive measures. 2. Hyponatremia clinically resolved continue to follow labs 3. Bilateral pneumonia treated continue to follow 4. Chronic atrial fibrillation rate controlled 5. Hemothorax thorax negative findings on chest film   I have personally seen and evaluated the patient, evaluated laboratory and imaging  results, formulated the assessment and plan and placed orders. The Patient requires high complexity decision making for assessment and support.  Case was discussed on Rounds with the Respiratory Therapy Staff  Allyne Gee, MD [facility] Pulmonary Critical Care Medicine Sleep Medicine

## 2019-02-01 ENCOUNTER — Other Ambulatory Visit (HOSPITAL_COMMUNITY): Payer: Non-veteran care

## 2019-02-01 ENCOUNTER — Encounter (HOSPITAL_COMMUNITY): Payer: Self-pay | Admitting: Interventional Radiology

## 2019-02-01 DIAGNOSIS — I482 Chronic atrial fibrillation, unspecified: Secondary | ICD-10-CM | POA: Diagnosis not present

## 2019-02-01 DIAGNOSIS — S272XXS Traumatic hemopneumothorax, sequela: Secondary | ICD-10-CM | POA: Diagnosis not present

## 2019-02-01 DIAGNOSIS — J189 Pneumonia, unspecified organism: Secondary | ICD-10-CM | POA: Diagnosis not present

## 2019-02-01 DIAGNOSIS — J9621 Acute and chronic respiratory failure with hypoxia: Secondary | ICD-10-CM | POA: Diagnosis not present

## 2019-02-01 HISTORY — PX: IR US GUIDE VASC ACCESS RIGHT: IMG2390

## 2019-02-01 HISTORY — PX: IR FLUORO GUIDE CV LINE RIGHT: IMG2283

## 2019-02-01 LAB — BASIC METABOLIC PANEL
Anion gap: 4 — ABNORMAL LOW (ref 5–15)
Anion gap: 7 (ref 5–15)
BUN: 125 mg/dL — ABNORMAL HIGH (ref 8–23)
BUN: 123 mg/dL — ABNORMAL HIGH (ref 8–23)
CHLORIDE: 112 mmol/L — AB (ref 98–111)
CO2: 24 mmol/L (ref 22–32)
CO2: 21 mmol/L — ABNORMAL LOW (ref 22–32)
Calcium: 7.5 mg/dL — ABNORMAL LOW (ref 8.9–10.3)
Calcium: 7.4 mg/dL — ABNORMAL LOW (ref 8.9–10.3)
Chloride: 114 mmol/L — ABNORMAL HIGH (ref 98–111)
Creatinine, Ser: 3.25 mg/dL — ABNORMAL HIGH (ref 0.61–1.24)
Creatinine, Ser: 3.14 mg/dL — ABNORMAL HIGH (ref 0.61–1.24)
GFR calc Af Amer: 20 mL/min — ABNORMAL LOW (ref >60)
GFR calc Af Amer: 21 mL/min — ABNORMAL LOW (ref >60)
GFR calc non Af Amer: 17 mL/min — ABNORMAL LOW (ref >60)
GFR calc non Af Amer: 18 mL/min — ABNORMAL LOW (ref >60)
Glucose, Bld: 144 mg/dL — ABNORMAL HIGH (ref 70–99)
Glucose, Bld: 120 mg/dL — ABNORMAL HIGH (ref 70–99)
Potassium: 5 mmol/L (ref 3.5–5.1)
Potassium: 5 mmol/L (ref 3.5–5.1)
Sodium: 142 mmol/L (ref 135–145)
Sodium: 140 mmol/L (ref 135–145)

## 2019-02-01 LAB — BLOOD GAS, ARTERIAL
ACID-BASE DEFICIT: 6.8 mmol/L — AB (ref 0.0–2.0)
ACID-BASE DEFICIT: 5.6 mmol/L — AB (ref 0.0–2.0)
Acid-base deficit: 5.5 mmol/L — ABNORMAL HIGH (ref 0.0–2.0)
Acid-base deficit: 6.5 mmol/L — ABNORMAL HIGH (ref 0.0–2.0)
Acid-base deficit: 6.7 mmol/L — ABNORMAL HIGH (ref 0.0–2.0)
BICARBONATE: 20.4 mmol/L (ref 20.0–28.0)
Bicarbonate: 19.7 mmol/L — ABNORMAL LOW (ref 20.0–28.0)
Bicarbonate: 20 mmol/L (ref 20.0–28.0)
Bicarbonate: 20.7 mmol/L (ref 20.0–28.0)
Bicarbonate: 21 mmol/L (ref 20.0–28.0)
FIO2: 0.5
FIO2: 0.5
FIO2: 40
FIO2: 50
FIO2: 50
O2 Saturation: 94.3 %
O2 Saturation: 95.5 %
O2 Saturation: 95.5 %
O2 Saturation: 96.2 %
O2 Saturation: 96.9 %
PEEP/CPAP: 5 cmH2O
PEEP: 5 cmH2O
PEEP: 5 cmH2O
PEEP: 5 cmH2O
PEEP: 8 cmH2O
PH ART: 7.235 — AB (ref 7.350–7.450)
PRESSURE CONTROL: 24 cmH2O
Patient temperature: 98.6
Patient temperature: 98.6
Patient temperature: 98.6
Patient temperature: 98.6
Patient temperature: 98.6
Pressure control: 24 cmH2O
Pressure control: 30 cmH2O
Pressure control: 30 cmH2O
Pressure control: 30 cmH2O
RATE: 12 resp/min
RATE: 12 resp/min
RATE: 20 resp/min
RATE: 20 resp/min
RATE: 22 resp/min
pCO2 arterial: 50.3 mmHg — ABNORMAL HIGH (ref 32.0–48.0)
pCO2 arterial: 50.6 mmHg — ABNORMAL HIGH (ref 32.0–48.0)
pCO2 arterial: 51.2 mmHg — ABNORMAL HIGH (ref 32.0–48.0)
pCO2 arterial: 53.5 mmHg — ABNORMAL HIGH (ref 32.0–48.0)
pCO2 arterial: 58.2 mmHg — ABNORMAL HIGH (ref 32.0–48.0)
pH, Arterial: 7.171 — CL (ref 7.350–7.450)
pH, Arterial: 7.216 — ABNORMAL LOW (ref 7.350–7.450)
pH, Arterial: 7.218 — ABNORMAL LOW (ref 7.350–7.450)
pH, Arterial: 7.218 — ABNORMAL LOW (ref 7.350–7.450)
pO2, Arterial: 76.8 mmHg — ABNORMAL LOW (ref 83.0–108.0)
pO2, Arterial: 82.1 mmHg — ABNORMAL LOW (ref 83.0–108.0)
pO2, Arterial: 84 mmHg (ref 83.0–108.0)
pO2, Arterial: 88.3 mmHg (ref 83.0–108.0)
pO2, Arterial: 90.3 mmHg (ref 83.0–108.0)

## 2019-02-01 LAB — CBC
HEMATOCRIT: 27.6 % — AB (ref 39.0–52.0)
HEMATOCRIT: 23.9 % — AB (ref 39.0–52.0)
Hemoglobin: 8 g/dL — ABNORMAL LOW (ref 13.0–17.0)
Hemoglobin: 7 g/dL — ABNORMAL LOW (ref 13.0–17.0)
MCH: 29.3 pg (ref 26.0–34.0)
MCH: 29.7 pg (ref 26.0–34.0)
MCHC: 29 g/dL — AB (ref 30.0–36.0)
MCHC: 29.3 g/dL — ABNORMAL LOW (ref 30.0–36.0)
MCV: 101.1 fL — ABNORMAL HIGH (ref 80.0–100.0)
MCV: 101.3 fL — ABNORMAL HIGH (ref 80.0–100.0)
Platelets: 65 10*3/uL — ABNORMAL LOW (ref 150–400)
Platelets: 55 10*3/uL — ABNORMAL LOW (ref 150–400)
RBC: 2.73 MIL/uL — ABNORMAL LOW (ref 4.22–5.81)
RBC: 2.36 MIL/uL — ABNORMAL LOW (ref 4.22–5.81)
RDW: 16.3 % — ABNORMAL HIGH (ref 11.5–15.5)
RDW: 16.1 % — ABNORMAL HIGH (ref 11.5–15.5)
WBC: 10.7 10*3/uL — ABNORMAL HIGH (ref 4.0–10.5)
WBC: 7.7 10*3/uL (ref 4.0–10.5)
nRBC: 0 % (ref 0.0–0.2)
nRBC: 0 % (ref 0.0–0.2)

## 2019-02-01 LAB — DIC (DISSEMINATED INTRAVASCULAR COAGULATION)PANEL
D-Dimer, Quant: 19.06 ug/mL-FEU — ABNORMAL HIGH (ref 0.00–0.50)
Fibrinogen: 229 mg/dL (ref 210–475)
INR: 1.35
Platelets: 57 10*3/uL — ABNORMAL LOW (ref 150–400)
Smear Review: NONE SEEN
aPTT: 28 seconds (ref 24–36)

## 2019-02-01 LAB — RENAL FUNCTION PANEL
ANION GAP: 5 (ref 5–15)
Albumin: 2.2 g/dL — ABNORMAL LOW (ref 3.5–5.0)
BUN: 120 mg/dL — ABNORMAL HIGH (ref 8–23)
CO2: 21 mmol/L — ABNORMAL LOW (ref 22–32)
Calcium: 7.5 mg/dL — ABNORMAL LOW (ref 8.9–10.3)
Chloride: 117 mmol/L — ABNORMAL HIGH (ref 98–111)
Creatinine, Ser: 3.15 mg/dL — ABNORMAL HIGH (ref 0.61–1.24)
GFR calc Af Amer: 21 mL/min — ABNORMAL LOW (ref >60)
GFR calc non Af Amer: 18 mL/min — ABNORMAL LOW (ref >60)
Glucose, Bld: 150 mg/dL — ABNORMAL HIGH (ref 70–99)
POTASSIUM: 5.3 mmol/L — AB (ref 3.5–5.1)
Phosphorus: 6.9 mg/dL — ABNORMAL HIGH (ref 2.5–4.6)
Sodium: 143 mmol/L (ref 135–145)

## 2019-02-01 LAB — DIC (DISSEMINATED INTRAVASCULAR COAGULATION) PANEL: PROTHROMBIN TIME: 16.6 s — AB (ref 11.4–15.2)

## 2019-02-01 LAB — VANCOMYCIN, TROUGH: Vancomycin Tr: 24 ug/mL (ref 15–20)

## 2019-02-01 LAB — PREPARE RBC (CROSSMATCH)

## 2019-02-01 LAB — LACTIC ACID, PLASMA
Lactic Acid, Venous: 1.1 mmol/L (ref 0.5–1.9)
Lactic Acid, Venous: 0.9 mmol/L (ref 0.5–1.9)

## 2019-02-01 LAB — HEPARIN INDUCED PLATELET AB (HIT ANTIBODY): Heparin Induced Plt Ab: 0.308 OD (ref 0.000–0.400)

## 2019-02-01 LAB — PROCALCITONIN: Procalcitonin: 0.44 ng/mL

## 2019-02-01 MED ORDER — LIDOCAINE HCL 1 % IJ SOLN
INTRAMUSCULAR | Status: AC
  Filled 2019-02-01: qty 20

## 2019-02-01 MED ORDER — LIDOCAINE HCL (PF) 1 % IJ SOLN
INTRAMUSCULAR | Status: AC | PRN
  Administered 2019-02-01: 10 mL

## 2019-02-01 MED ORDER — HEPARIN SODIUM (PORCINE) 1000 UNIT/ML IJ SOLN
INTRAMUSCULAR | Status: AC
  Administered 2019-02-01: 3000 [IU]
  Filled 2019-02-01: qty 1

## 2019-02-01 NOTE — Procedures (Signed)
Pre-procedure Diagnosis: ESRD Post-procedure Diagnosis: Same  Successful placement of a non-tunneled R CFV approach HD catheter with tips terminating within the inferior aspect of the IVC.  Complications: None Immediate  EBL: Minimal   The catheter is ready for immediate use.   Ronny Bacon, MD Pager #: [phone]

## 2019-02-01 NOTE — Progress Notes (Addendum)
Pulmonary Critical Care Medicine [place]   PULMONARY CRITICAL CARE SERVICE  PROGRESS NOTE  Date of Service: 02/01/2019  Gabriel Jensen  UVO:[id number]  DOB: 1941-02-05   DOA: 01/21/2019  Referring Physician: Merton Border, MD  HPI: Gabriel Jensen is a 78 y.o. male seen for follow up of Acute on Chronic Respiratory Failure.  Patient's a.m. chest x-ray continues to show pulmonary edema with worsening left midlung opacities.  His blood gases show acidosis with an elevated CO2.  Respiratory has been titrating his vent settings.  Currently our [facility] rate of 22 IP 30 FiO2 50% PEEP of 10.  Will repeat blood gas with the settings.  Medications: Reviewed on Rounds  Physical Exam:  Vitals: Pulse 66 respirations 18 BP 122/56 O2 sat 97% temp 98.7  Ventilator Settings ventilator mode AC PC rate of 22 IP 30 FiO2 50% PEEP of 10  . General: Comfortable at this time . Eyes: Grossly normal lids, irises & conjunctiva . ENT: grossly tongue is normal . Neck: no obvious mass . Cardiovascular: S1 S2 normal no gallop . Respiratory: Coarse breath sounds . Abdomen: soft . Skin: no rash seen on limited exam . Musculoskeletal: not rigid . Psychiatric:unable to assess . Neurologic: no seizure no involuntary movements         Lab Data:   Basic Metabolic Panel: Recent Labs  Lab 01/28/19 0506 01/30/19 0420 01/31/19 0848 01/31/19 1433 02/01/19 0218 02/01/19 1314  NA 146* 145 144 141 143 142  K 4.9 5.2* 5.1 5.1 5.3* 5.0  CL 118* 119* 119* 115* 117* 114*  CO2 20* 18* 19* 18* 21* 24  GLUCOSE 159* 148* 177* 168* 150* 144*  BUN 86* 103* 117* 119* 120* 125*  CREATININE 2.52* 3.04* 3.13* 3.16* 3.15* 3.25*  CALCIUM 7.7* 7.3* 7.2* 7.3* 7.5* 7.5*  MG 2.4 2.2  --   --   --   --   PHOS  --   --  6.5* 6.7* 6.9*  --     ABG: Recent Labs  Lab 02/01/19 0024 02/01/19 0337 02/01/19 0617 02/01/19 0825 02/01/19 1510  PHART 7.218* 7.171* 7.216* 7.235* 7.218*  PCO2ART 50.3* 58.2* 51.2*  50.6* 53.5*  PO2ART 76.8* 84.0 88.3 82.1* 90.3  HCO3 19.7* 20.4 20.0 20.7 21.0  O2SAT 94.3 95.5 96.9 95.5 96.2    Liver Function Tests: Recent Labs  Lab 01/30/19 0420 01/31/19 0848 01/31/19 1433 02/01/19 0218  AST 22  --   --   --   ALT 27  --   --   --   ALKPHOS 57  --   --   --   BILITOT 0.6  --   --   --   PROT 5.4*  --   --   --   ALBUMIN 1.6* 1.7* 1.9* 2.2*   No results for input(s): LIPASE, AMYLASE in the last 168 hours. No results for input(s): AMMONIA in the last 168 hours.  CBC: Recent Labs  Lab 01/26/19 0216  01/30/19 0420 01/31/19 0848 01/31/19 1433 02/01/19 0218 02/01/19 1058  WBC 17.8*   < > 8.1 7.7 7.7 10.7* 7.7  NEUTROABS 16.7*  --   --   --   --   --   --   HGB 7.4*   < > 6.9* 7.2* 7.1* 8.0* 7.0*  HCT 25.0*   < > 22.7* 23.5* 23.3* 27.6* 23.9*  MCV 105.5*   < > 101.8* 99.6 99.1 101.1* 101.3*  PLT 126*   < > 65* 55*  58* 65* 55*   < > = values in this interval not displayed.    Cardiac Enzymes: No results for input(s): CKTOTAL, CKMB, CKMBINDEX, TROPONINI in the last 168 hours.  BNP (last 3 results) No results for input(s): BNP in the last 8760 hours.  ProBNP (last 3 results) No results for input(s): PROBNP in the last 8760 hours.  Radiological Exams: Ir Fluoro Guide Cv Line Right  Result Date: 02/01/2019 INDICATION: End-stage renal disease, in need of placement temporary dialysis catheter for the initiation of dialysis. EXAM: NON-TUNNELED CENTRAL VENOUS HEMODIALYSIS CATHETER PLACEMENT WITH ULTRASOUND AND FLUOROSCOPIC GUIDANCE COMPARISON:  CT abdomen pelvis-01/23/2019 MEDICATIONS: None FLUOROSCOPY TIME:  6 seconds (1 mGy) COMPLICATIONS: None immediate. PROCEDURE: Informed written consent was obtained from patient's family after a discussion of the risks, benefits, and alternatives to treatment. Questions regarding the procedure were encouraged and answered. Patient presents today in a C-collar which is unable to be removed the decision was made to  place a right common femoral vein approach temporary dialysis catheter As such, the right groin was prepped with chlorhexidine in a sterile fashion, and a sterile drape was applied covering the operative field. Maximum barrier sterile technique with sterile gowns and gloves were used for the procedure. A timeout was performed prior to the initiation of the procedure. After the overlying soft tissues were anesthetized, a small venotomy incision was created and a micropuncture kit was utilized to access the right common femoral vein. Real-time ultrasound guidance was utilized for vascular access including the acquisition of a permanent ultrasound image documenting patency of the accessed vessel. The microwire was utilized to measure appropriate catheter length. A stiff glidewire was advanced to the level of the IVC. Under fluoroscopic guidance, the venotomy was serially dilated, ultimately allowing placement of a 24 cm temporary [place] catheter with tip ultimately terminating within the superior aspect of the right atrium. Final catheter positioning was confirmed and documented with a spot fluoroscopic image. The catheter aspirates and flushes normally. The catheter was flushed with appropriate volume heparin dwells. The catheter exit site was secured with a 0-Prolene retention suture. A dressing was placed. The patient tolerated the procedure well without immediate post procedural complication. IMPRESSION: Successful placement of a right common femoral vein approach 24 cm temporary dialysis catheter with tip terminating within the inferior aspect of the IVC. The catheter is ready for immediate use. PLAN: This catheter may be converted to a tunneled dialysis catheter at a later date as indicated. Electronically Signed   By: Sandi Mariscal M.D.   On: 02/01/2019 14:55   Ir US Guide Vasc Access Right  Result Date: 02/01/2019 INDICATION: End-stage renal disease, in need of placement temporary dialysis catheter for the  initiation of dialysis. EXAM: NON-TUNNELED CENTRAL VENOUS HEMODIALYSIS CATHETER PLACEMENT WITH ULTRASOUND AND FLUOROSCOPIC GUIDANCE COMPARISON:  CT abdomen pelvis-01/23/2019 MEDICATIONS: None FLUOROSCOPY TIME:  6 seconds (1 mGy) COMPLICATIONS: None immediate. PROCEDURE: Informed written consent was obtained from patient's family after a discussion of the risks, benefits, and alternatives to treatment. Questions regarding the procedure were encouraged and answered. Patient presents today in a C-collar which is unable to be removed the decision was made to place a right common femoral vein approach temporary dialysis catheter As such, the right groin was prepped with chlorhexidine in a sterile fashion, and a sterile drape was applied covering the operative field. Maximum barrier sterile technique with sterile gowns and gloves were used for the procedure. A timeout was performed prior to the initiation of the procedure. After  the overlying soft tissues were anesthetized, a small venotomy incision was created and a micropuncture kit was utilized to access the right common femoral vein. Real-time ultrasound guidance was utilized for vascular access including the acquisition of a permanent ultrasound image documenting patency of the accessed vessel. The microwire was utilized to measure appropriate catheter length. A stiff glidewire was advanced to the level of the IVC. Under fluoroscopic guidance, the venotomy was serially dilated, ultimately allowing placement of a 24 cm temporary Mah[place]kar catheter with tip ultimately terminating within the superior aspect of the right atrium. Final catheter positioning was confirmed and documented with a spot fluoroscopic image. The catheter aspirates and flushes normally. The catheter was flushed with appropriate volume heparin dwells. The catheter exit site was secured with a 0-Prolene retention suture. A dressing was placed. The patient tolerated the procedure well without immediate  post procedural complication. IMPRESSION: Successful placement of a right common femoral vein approach 24 cm temporary dialysis catheter with tip terminating within the inferior aspect of the IVC. The catheter is ready for immediate use. PLAN: This catheter may be converted to a tunneled dialysis catheter at a later date as indicated. Electronically Signed   By: Sandi Mariscal M.D.   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 Stable position of support apparatus. Pulmonary vasculature remains indistinct with cephalization of flow. Suspected worsening of left mid lung heterogeneous/consolidative airspace opacities. Trace pleural effusions are not excluded. No pneumothorax. No acute osseous abnormalities. IMPRESSION: 1.  Stable positioning of support apparatus.  No pneumothorax. 2. Similar findings of pulmonary edema with worsening left mid lung heterogeneous opacities, potentially asymmetric alveolar pulmonary edema though aspiration and/or infection could have a similar appearance. Continued attention on follow-up is advised. Electronically Signed   By: Sandi Mariscal M.D.   On: 02/01/2019 12:03    Assessment/Plan Active Problems:   Acute on chronic respiratory failure with hypoxia (HCC)   Hyponatremia   Bilateral pneumonia   Chronic atrial fibrillation   Hemothorax with pneumothorax, traumatic, sequela   Hypotension   1. Acute on chronic respiratory failure with hypoxia continue pressure control mode and titrate oxygen as tolerated.  Continue aggressive pulmonary toilet and supportive measures.  Continue to follow ABGs.  Repeat chest x-ray ordered for a.m. 2. Hyponatremia clinically resolved continue to follow labs 3. Bilateral pneumonia treated, however chest x-ray  appears to be worsening.  Aspiration versus pneumonia. 4. Chronic atrial fibrillation rate controlled 5. Hemothorax negative findings on chest film.   I have personally seen and evaluated the patient, evaluated laboratory and imaging results, formulated the assessment and plan and placed orders. The Patient requires high complexity decision making for assessment and support.  Case was discussed on Rounds with the Respiratory Therapy Staff  Allyne Gee, MD [facility] Pulmonary Critical Care Medicine Sleep Medicine

## 2019-02-02 ENCOUNTER — Other Ambulatory Visit (HOSPITAL_COMMUNITY): Payer: Non-veteran care

## 2019-02-02 DIAGNOSIS — S272XXS Traumatic hemopneumothorax, sequela: Secondary | ICD-10-CM | POA: Diagnosis not present

## 2019-02-02 DIAGNOSIS — J9621 Acute and chronic respiratory failure with hypoxia: Secondary | ICD-10-CM | POA: Diagnosis not present

## 2019-02-02 DIAGNOSIS — J189 Pneumonia, unspecified organism: Secondary | ICD-10-CM | POA: Diagnosis not present

## 2019-02-02 DIAGNOSIS — I482 Chronic atrial fibrillation, unspecified: Secondary | ICD-10-CM | POA: Diagnosis not present

## 2019-02-02 LAB — RENAL FUNCTION PANEL
Albumin: 1.9 g/dL — ABNORMAL LOW (ref 3.5–5.0)
Albumin: 1.9 g/dL — ABNORMAL LOW (ref 3.5–5.0)
Anion gap: 9 (ref 5–15)
Anion gap: 11 (ref 5–15)
BUN: 128 mg/dL — ABNORMAL HIGH (ref 8–23)
BUN: 131 mg/dL — ABNORMAL HIGH (ref 8–23)
CHLORIDE: 114 mmol/L — AB (ref 98–111)
CO2: 20 mmol/L — ABNORMAL LOW (ref 22–32)
CO2: 17 mmol/L — ABNORMAL LOW (ref 22–32)
Calcium: 7.5 mg/dL — ABNORMAL LOW (ref 8.9–10.3)
Calcium: 7.5 mg/dL — ABNORMAL LOW (ref 8.9–10.3)
Chloride: 114 mmol/L — ABNORMAL HIGH (ref 98–111)
Creatinine, Ser: 3.11 mg/dL — ABNORMAL HIGH (ref 0.61–1.24)
Creatinine, Ser: 3.6 mg/dL — ABNORMAL HIGH (ref 0.61–1.24)
GFR calc Af Amer: 21 mL/min — ABNORMAL LOW (ref >60)
GFR calc Af Amer: 18 mL/min — ABNORMAL LOW (ref >60)
GFR calc non Af Amer: 15 mL/min — ABNORMAL LOW (ref >60)
GFR, EST NON AFRICAN AMERICAN: 18 mL/min — AB (ref >60)
Glucose, Bld: 145 mg/dL — ABNORMAL HIGH (ref 70–99)
Glucose, Bld: 136 mg/dL — ABNORMAL HIGH (ref 70–99)
Phosphorus: 6.5 mg/dL — ABNORMAL HIGH (ref 2.5–4.6)
Phosphorus: 6.6 mg/dL — ABNORMAL HIGH (ref 2.5–4.6)
Potassium: 5.3 mmol/L — ABNORMAL HIGH (ref 3.5–5.1)
Potassium: 5.5 mmol/L — ABNORMAL HIGH (ref 3.5–5.1)
Sodium: 143 mmol/L (ref 135–145)
Sodium: 142 mmol/L (ref 135–145)

## 2019-02-02 LAB — BASIC METABOLIC PANEL
Anion gap: 10 (ref 5–15)
BUN: 129 mg/dL — ABNORMAL HIGH (ref 8–23)
CO2: 19 mmol/L — ABNORMAL LOW (ref 22–32)
CREATININE: 3.19 mg/dL — AB (ref 0.61–1.24)
Calcium: 7.5 mg/dL — ABNORMAL LOW (ref 8.9–10.3)
Chloride: 114 mmol/L — ABNORMAL HIGH (ref 98–111)
GFR calc Af Amer: 21 mL/min — ABNORMAL LOW (ref >60)
GFR calc non Af Amer: 18 mL/min — ABNORMAL LOW (ref >60)
Glucose, Bld: 135 mg/dL — ABNORMAL HIGH (ref 70–99)
Potassium: 5.6 mmol/L — ABNORMAL HIGH (ref 3.5–5.1)
SODIUM: 143 mmol/L (ref 135–145)

## 2019-02-02 LAB — CBC
HEMATOCRIT: 28 % — AB (ref 39.0–52.0)
Hemoglobin: 8.5 g/dL — ABNORMAL LOW (ref 13.0–17.0)
MCH: 29.8 pg (ref 26.0–34.0)
MCHC: 30.4 g/dL (ref 30.0–36.0)
MCV: 98.2 fL (ref 80.0–100.0)
Platelets: 67 10*3/uL — ABNORMAL LOW (ref 150–400)
RBC: 2.85 MIL/uL — ABNORMAL LOW (ref 4.22–5.81)
RDW: 16.4 % — ABNORMAL HIGH (ref 11.5–15.5)
WBC: 10.1 10*3/uL (ref 4.0–10.5)
nRBC: 0 % (ref 0.0–0.2)

## 2019-02-02 LAB — BLOOD GAS, ARTERIAL
ACID-BASE DEFICIT: 5.1 mmol/L — AB (ref 0.0–2.0)
Bicarbonate: 20.2 mmol/L (ref 20.0–28.0)
FIO2: 50
O2 Saturation: 98.3 %
PH ART: 7.311 — AB (ref 7.350–7.450)
Patient temperature: 97.7
Pressure control: 22 cmH2O
RATE: 30 resp/min
pCO2 arterial: 41 mmHg (ref 32.0–48.0)
pO2, Arterial: 112 mmHg — ABNORMAL HIGH (ref 83.0–108.0)

## 2019-02-02 LAB — HEPARIN INDUCED PLATELET AB (HIT ANTIBODY): Heparin Induced Plt Ab: 0.265 OD (ref 0.000–0.400)

## 2019-02-02 LAB — HEPATITIS B SURFACE ANTIGEN: Hepatitis B Surface Ag: NEGATIVE

## 2019-02-02 LAB — VANCOMYCIN, TROUGH: Vancomycin Tr: 20 ug/mL (ref 15–20)

## 2019-02-02 LAB — HEPATITIS B SURFACE ANTIBODY,QUALITATIVE: Hep B S Ab: NONREACTIVE

## 2019-02-02 LAB — LACTIC ACID, PLASMA
Lactic Acid, Venous: 1 mmol/L (ref 0.5–1.9)
Lactic Acid, Venous: 1.1 mmol/L (ref 0.5–1.9)

## 2019-02-02 LAB — HEPATITIS B CORE ANTIBODY, TOTAL: Hep B Core Total Ab: NEGATIVE
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
## 2019-02-02 NOTE — Progress Notes (Signed)
Pulmonary Critical Care Medicine [place]   PULMONARY CRITICAL CARE SERVICE  PROGRESS NOTE  Date of Service: 02/02/2019  Gabriel Jensen  HLK:[id number]  DOB: [birth year]   DOA: 01/21/2019  Referring Physician: Merton Border, MD  HPI: Gabriel Jensen is a 78 y.o. male seen for follow up of Acute on Chronic Respiratory Failure.  Patient was seen by nephrology had a hemodialysis catheter placed and is undergoing dialysis now.  Remains on pressure control mode with an inspiratory pressure of 30 has been significantly acidotic  Medications: Reviewed on Rounds  Physical Exam:  Vitals: Temperature 97.4 pulse 98 respiratory 25 blood pressure 101/48 saturations 100%  Ventilator Settings mode ventilation pressure assist control FiO2 50% inspiratory pressure 30 tidal volume 350 PEEP of 10  . General: Comfortable at this time . Eyes: Grossly normal lids, irises & conjunctiva . ENT: grossly tongue is normal . Neck: no obvious mass . Cardiovascular: S1 S2 normal no gallop . Respiratory: Scattered rhonchi noted bilaterally . Abdomen: soft . Skin: no rash seen on limited exam . Musculoskeletal: not rigid . Psychiatric:unable to assess . Neurologic: no seizure no involuntary movements         Lab Data:   Basic Metabolic Panel: Recent Labs  Lab 01/28/19 0506 01/30/19 0420 01/31/19 0848 01/31/19 1433 02/01/19 0218 02/01/19 1314 02/01/19 1827 02/02/19 0928 02/02/19 1150 02/02/19 1201  NA 146* 145 144 141 143 142 140 143 142 143  K 4.9 5.2* 5.1 5.1 5.3* 5.0 5.0 5.3* 5.5* 5.6*  CL 118* 119* 119* 115* 117* 114* 112* 114* 114* 114*  CO2 20* 18* 19* 18* 21* 24 21* 20* 17* 19*  GLUCOSE 159* 148* 177* 168* 150* 144* 120* 145* 136* 135*  BUN 86* 103* 117* 119* 120* 125* 123* 128* 131* 129*  CREATININE 2.52* 3.04* 3.13* 3.16* 3.15* 3.25* 3.14* 3.11* 3.60* 3.19*  CALCIUM 7.7* 7.3* 7.2* 7.3* 7.5* 7.5* 7.4* 7.5* 7.5* 7.5*  MG 2.4 2.2  --   --   --   --   --   --   --   --    PHOS  --   --  6.5* 6.7* 6.9*  --   --  6.5* 6.6*  --     ABG: Recent Labs  Lab 02/01/19 0337 02/01/19 0617 02/01/19 0825 02/01/19 1510 02/02/19 0450  PHART 7.171* 7.216* 7.235* 7.218* 7.311*  PCO2ART 58.2* 51.2* 50.6* 53.5* 41.0  PO2ART 84.0 88.3 82.1* 90.3 112*  HCO3 20.4 20.0 20.7 21.0 20.2  O2SAT 95.5 96.9 95.5 96.2 98.3    Liver Function Tests: Recent Labs  Lab 01/30/19 0420 01/31/19 0848 01/31/19 1433 02/01/19 0218 02/02/19 0928 02/02/19 1150  AST 22  --   --   --   --   --   ALT 27  --   --   --   --   --   ALKPHOS 57  --   --   --   --   --   BILITOT 0.6  --   --   --   --   --   PROT 5.4*  --   --   --   --   --   ALBUMIN 1.6* 1.7* 1.9* 2.2* 1.9* 1.9*   No results for input(s): LIPASE, AMYLASE in the last 168 hours. No results for input(s): AMMONIA in the last 168 hours.  CBC: Recent Labs  Lab 01/31/19 0848 01/31/19 1433 02/01/19 0218 02/01/19 1058 02/01/19 1827 02/02/19 1150  WBC 7.7 7.7  10.7* 7.7  --  10.1  HGB 7.2* 7.1* 8.0* 7.0*  --  8.5*  HCT 23.5* 23.3* 27.6* 23.9*  --  28.0*  MCV 99.6 99.1 101.1* 101.3*  --  98.2  PLT 55* 58* 65* 55* 57* 67*    Cardiac Enzymes: No results for input(s): CKTOTAL, CKMB, CKMBINDEX, TROPONINI in the last 168 hours.  BNP (last 3 results) No results for input(s): BNP in the last 8760 hours.  ProBNP (last 3 results) No results for input(s): PROBNP in the last 8760 hours.  Radiological Exams: Ir Fluoro Guide Cv Line Right  Result Date: 02/01/2019 INDICATION: End-stage renal disease, in need of placement temporary dialysis catheter for the initiation of dialysis. EXAM: NON-TUNNELED CENTRAL VENOUS HEMODIALYSIS CATHETER PLACEMENT WITH ULTRASOUND AND FLUOROSCOPIC GUIDANCE COMPARISON:  CT abdomen pelvis-01/23/2019 MEDICATIONS: None FLUOROSCOPY TIME:  6 seconds (1 mGy) COMPLICATIONS: None immediate. PROCEDURE: Informed written consent was obtained from patient's family after a discussion of the risks, benefits, and  alternatives to treatment. Questions regarding the procedure were encouraged and answered. Patient presents today in a C-collar which is unable to be removed the decision was made to place a right common femoral vein approach temporary dialysis catheter As such, the right groin was prepped with chlorhexidine in a sterile fashion, and a sterile drape was applied covering the operative field. Maximum barrier sterile technique with sterile gowns and gloves were used for the procedure. A timeout was performed prior to the initiation of the procedure. After the overlying soft tissues were anesthetized, a small venotomy incision was created and a micropuncture kit was utilized to access the right common femoral vein. Real-time ultrasound guidance was utilized for vascular access including the acquisition of a permanent ultrasound image documenting patency of the accessed vessel. The microwire was utilized to measure appropriate catheter length. A stiff glidewire was advanced to the level of the IVC. Under fluoroscopic guidance, the venotomy was serially dilated, ultimately allowing placement of a 24 cm temporary Mahurkar catheter with tip ultimately terminating within the superior aspect of the right atrium. Final catheter positioning was confirmed and documented with a spot fluoroscopic image. The catheter aspirates and flushes normally. The catheter was flushed with appropriate volume heparin dwells. The catheter exit site was secured with a 0-Prolene retention suture. A dressing was placed. The patient tolerated the procedure well without immediate post procedural complication. IMPRESSION: Successful placement of a right common femoral vein approach 24 cm temporary dialysis catheter with tip terminating within the inferior aspect of the IVC. The catheter is ready for immediate use. PLAN: This catheter may be converted to a tunneled dialysis catheter at a later date as indicated. Electronically Signed   By: Sandi Mariscal  M.D.   On: 02/01/2019 14:55   Ir US Guide Vasc Access Right  Result Date: 02/01/2019 INDICATION: End-stage renal disease, in need of placement temporary dialysis catheter for the initiation of dialysis. EXAM: NON-TUNNELED CENTRAL VENOUS HEMODIALYSIS CATHETER PLACEMENT WITH ULTRASOUND AND FLUOROSCOPIC GUIDANCE COMPARISON:  CT abdomen pelvis-01/23/2019 MEDICATIONS: None FLUOROSCOPY TIME:  6 seconds (1 mGy) COMPLICATIONS: None immediate. PROCEDURE: Informed written consent was obtained from patient's family after a discussion of the risks, benefits, and alternatives to treatment. Questions regarding the procedure were encouraged and answered. Patient presents today in a C-collar which is unable to be removed the decision was made to place a right common femoral vein approach temporary dialysis catheter As such, the right groin was prepped with chlorhexidine in a sterile fashion, and a sterile drape was applied  covering the operative field. Maximum barrier sterile technique with sterile gowns and gloves were used for the procedure. A timeout was performed prior to the initiation of the procedure. After the overlying soft tissues were anesthetized, a small venotomy incision was created and a micropuncture kit was utilized to access the right common femoral vein. Real-time ultrasound guidance was utilized for vascular access including the acquisition of a permanent ultrasound image documenting patency of the accessed vessel. The microwire was utilized to measure appropriate catheter length. A stiff glidewire was advanced to the level of the IVC. Under fluoroscopic guidance, the venotomy was serially dilated, ultimately allowing placement of a 24 cm temporary Mahurkar catheter with tip ultimately terminating within the superior aspect of the right atrium. Final catheter positioning was confirmed and documented with a spot fluoroscopic image. The catheter aspirates and flushes normally. The catheter was flushed with  appropriate volume heparin dwells. The catheter exit site was secured with a 0-Prolene retention suture. A dressing was placed. The patient tolerated the procedure well without immediate post procedural complication. IMPRESSION: Successful placement of a right common femoral vein approach 24 cm temporary dialysis catheter with tip terminating within the inferior aspect of the IVC. The catheter is ready for immediate use. PLAN: This catheter may be converted to a tunneled dialysis catheter at a later date as indicated. Electronically Signed   By: Sandi Mariscal M.D.   On: 02/01/2019 14:55   Dg Chest Port 1 View  Result Date: 02/02/2019 CLINICAL DATA:  Respiratory failure, tracheostomy EXAM: PORTABLE CHEST 1 VIEW COMPARISON:  Portable exam 1055 hours compared to 02/01/2019 FINDINGS: Tracheostomy and nasogastric tube stable. Rotated to the RIGHT. Enlargement of cardiac silhouette post CABG. Diffuse BILATERAL airspace infiltrates favor pulmonary edema, with small bibasilar effusions. More focal opacity in the LEFT mid lung likely reflects asymmetric edema though superimposed infection not completely excluded. No pneumothorax. Bones demineralized. IMPRESSION: Persistent severe diffuse BILATERAL interstitial infiltrates favoring asymmetric pulmonary edema, with small associated bibasilar effusions. Electronically Signed   By: Lavonia Dana M.D.   On: 02/02/2019 11:14   Dg Chest Port 1 View  Result Date: 02/01/2019 CLINICAL DATA:  Respiratory distress EXAM: PORTABLE CHEST 1 VIEW COMPARISON:  01/30/2019; 01/26/2019 FINDINGS: Grossly unchanged enlarged cardiac silhouette and mediastinal contours post median sternotomy and CABG. Stable position of support apparatus. Pulmonary vasculature remains indistinct with cephalization of flow. Suspected worsening of left mid lung heterogeneous/consolidative airspace opacities. Trace pleural effusions are not excluded. No pneumothorax. No acute osseous abnormalities. IMPRESSION: 1.   Stable positioning of support apparatus.  No pneumothorax. 2. Similar findings of pulmonary edema with worsening left mid lung heterogeneous opacities, potentially asymmetric alveolar pulmonary edema though aspiration and/or infection could have a similar appearance. Continued attention on follow-up is advised. Electronically Signed   By: Sandi Mariscal M.D.   On: 02/01/2019 12:03    Assessment/Plan Active Problems:   Acute on chronic respiratory failure with hypoxia (HCC)   Hyponatremia   Bilateral pneumonia   Chronic atrial fibrillation   Hemothorax with pneumothorax, traumatic, sequela   Hypotension   1. Acute on chronic respiratory failure with hypoxia patient is on the ventilator full support note able to do any weaning at this point 2. Hyponatremia following with nephrology recommendations 3. Bilateral pneumonia treated 4. Chronic atrial fibrillation rate controlled 5. Hemothorax with pneumothorax resolved 6. Hypotension hemodynamically is more stable now   I have personally seen and evaluated the patient, evaluated laboratory and imaging results, formulated the assessment and plan and placed  orders. The Patient requires high complexity decision making for assessment and support.  Case was discussed on Rounds with the Respiratory Therapy Staff  Allyne Gee, MD [facility] Pulmonary Critical Care Medicine Sleep Medicine

## 2019-02-03 ENCOUNTER — Other Ambulatory Visit (HOSPITAL_COMMUNITY): Payer: Non-veteran care

## 2019-02-03 DIAGNOSIS — S272XXS Traumatic hemopneumothorax, sequela: Secondary | ICD-10-CM | POA: Diagnosis not present

## 2019-02-03 DIAGNOSIS — I482 Chronic atrial fibrillation, unspecified: Secondary | ICD-10-CM | POA: Diagnosis not present

## 2019-02-03 DIAGNOSIS — J189 Pneumonia, unspecified organism: Secondary | ICD-10-CM | POA: Diagnosis not present

## 2019-02-03 DIAGNOSIS — J9621 Acute and chronic respiratory failure with hypoxia: Secondary | ICD-10-CM | POA: Diagnosis not present

## 2019-02-03 LAB — RENAL FUNCTION PANEL
Albumin: 1.8 g/dL — ABNORMAL LOW (ref 3.5–5.0)
Anion gap: 9 (ref 5–15)
BUN: 136 mg/dL — ABNORMAL HIGH (ref 8–23)
CALCIUM: 7.5 mg/dL — AB (ref 8.9–10.3)
CO2: 21 mmol/L — ABNORMAL LOW (ref 22–32)
Chloride: 115 mmol/L — ABNORMAL HIGH (ref 98–111)
Creatinine, Ser: 3.12 mg/dL — ABNORMAL HIGH (ref 0.61–1.24)
GFR calc Af Amer: 21 mL/min — ABNORMAL LOW (ref >60)
GFR calc non Af Amer: 18 mL/min — ABNORMAL LOW (ref >60)
Glucose, Bld: 154 mg/dL — ABNORMAL HIGH (ref 70–99)
Phosphorus: 6.8 mg/dL — ABNORMAL HIGH (ref 2.5–4.6)
Potassium: 5.2 mmol/L — ABNORMAL HIGH (ref 3.5–5.1)
SODIUM: 145 mmol/L (ref 135–145)

## 2019-02-03 LAB — BASIC METABOLIC PANEL
Anion gap: 9 (ref 5–15)
BUN: 109 mg/dL — ABNORMAL HIGH (ref 8–23)
CO2: 22 mmol/L (ref 22–32)
Calcium: 7.5 mg/dL — ABNORMAL LOW (ref 8.9–10.3)
Chloride: 112 mmol/L — ABNORMAL HIGH (ref 98–111)
Creatinine, Ser: 2.51 mg/dL — ABNORMAL HIGH (ref 0.61–1.24)
GFR calc Af Amer: 28 mL/min — ABNORMAL LOW (ref >60)
GFR calc non Af Amer: 24 mL/min — ABNORMAL LOW (ref >60)
Glucose, Bld: 140 mg/dL — ABNORMAL HIGH (ref 70–99)
POTASSIUM: 4.7 mmol/L (ref 3.5–5.1)
Sodium: 143 mmol/L (ref 135–145)

## 2019-02-03 LAB — CBC
HCT: 25 % — ABNORMAL LOW (ref 39.0–52.0)
HEMOGLOBIN: 7.7 g/dL — AB (ref 13.0–17.0)
MCH: 30.1 pg (ref 26.0–34.0)
MCHC: 30.8 g/dL (ref 30.0–36.0)
MCV: 97.7 fL (ref 80.0–100.0)
Platelets: 62 10*3/uL — ABNORMAL LOW (ref 150–400)
RBC: 2.56 MIL/uL — ABNORMAL LOW (ref 4.22–5.81)
RDW: 16 % — ABNORMAL HIGH (ref 11.5–15.5)
WBC: 9.7 10*3/uL (ref 4.0–10.5)
nRBC: 0 % (ref 0.0–0.2)

## 2019-02-03 LAB — CULTURE, BLOOD (ROUTINE X 2)
CULTURE: NO GROWTH
Culture: NO GROWTH
Special Requests: ADEQUATE
Special Requests: ADEQUATE

## 2019-02-03 NOTE — Progress Notes (Signed)
Pulmonary Critical Care Medicine [place]   PULMONARY CRITICAL CARE SERVICE  PROGRESS NOTE  Date of Service: 02/03/2019  Gabriel Jensen  WUJ:[id number]  DOB: 1941/07/01   DOA: 01/21/2019  Referring Physician: Merton Border, MD  HPI: Gabriel Jensen is a 78 y.o. male seen for follow up of Acute on Chronic Respiratory Failure.  Currently on full vent support and pressure control mode patient is supposed to be dialyzed again today  Medications: Reviewed on Rounds  Physical Exam:  Vitals: Temperature 98.3 pulse 74 respiratory 20 blood pressure 114/61 saturations 96%  Ventilator Settings mode ventilation pressure control FiO2 50% respiratory rate 22 respiratory pressure 30 PEEP 10  . General: Comfortable at this time . Eyes: Grossly normal lids, irises & conjunctiva . ENT: grossly tongue is normal . Neck: no obvious mass . Cardiovascular: S1 S2 normal no gallop . Respiratory: Coarse breath sounds with some rhonchi noted . Abdomen: soft . Skin: no rash seen on limited exam . Musculoskeletal: not rigid . Psychiatric:unable to assess . Neurologic: no seizure no involuntary movements         Lab Data:   Basic Metabolic Panel: Recent Labs  Lab 01/28/19 0506 01/30/19 0420  01/31/19 1433 02/01/19 0218  02/01/19 1827 02/02/19 0928 02/02/19 1150 02/02/19 1201 02/03/19 0558  NA 146* 145   < > 141 143   < > 140 143 142 143 145  K 4.9 5.2*   < > 5.1 5.3*   < > 5.0 5.3* 5.5* 5.6* 5.2*  CL 118* 119*   < > 115* 117*   < > 112* 114* 114* 114* 115*  CO2 20* 18*   < > 18* 21*   < > 21* 20* 17* 19* 21*  GLUCOSE 159* 148*   < > 168* 150*   < > 120* 145* 136* 135* 154*  BUN 86* 103*   < > 119* 120*   < > 123* 128* 131* 129* 136*  CREATININE 2.52* 3.04*   < > 3.16* 3.15*   < > 3.14* 3.11* 3.60* 3.19* 3.12*  CALCIUM 7.7* 7.3*   < > 7.3* 7.5*   < > 7.4* 7.5* 7.5* 7.5* 7.5*  MG 2.4 2.2  --   --   --   --   --   --   --   --   --   PHOS  --   --    < > 6.7* 6.9*  --   --   6.5* 6.6*  --  6.8*   < > = values in this interval not displayed.    ABG: Recent Labs  Lab 02/01/19 0337 02/01/19 0617 02/01/19 0825 02/01/19 1510 02/02/19 0450  PHART 7.171* 7.216* 7.235* 7.218* 7.311*  PCO2ART 58.2* 51.2* 50.6* 53.5* 41.0  PO2ART 84.0 88.3 82.1* 90.3 112*  HCO3 20.4 20.0 20.7 21.0 20.2  O2SAT 95.5 96.9 95.5 96.2 98.3    Liver Function Tests: Recent Labs  Lab 01/30/19 0420  01/31/19 1433 02/01/19 0218 02/02/19 0928 02/02/19 1150 02/03/19 0558  AST 22  --   --   --   --   --   --   ALT 27  --   --   --   --   --   --   ALKPHOS 57  --   --   --   --   --   --   BILITOT 0.6  --   --   --   --   --   --  PROT 5.4*  --   --   --   --   --   --   ALBUMIN 1.6*   < > 1.9* 2.2* 1.9* 1.9* 1.8*   < > = values in this interval not displayed.   No results for input(s): LIPASE, AMYLASE in the last 168 hours. No results for input(s): AMMONIA in the last 168 hours.  CBC: Recent Labs  Lab 01/31/19 1433 02/01/19 0218 02/01/19 1058 02/01/19 1827 02/02/19 1150 02/03/19 0558  WBC 7.7 10.7* 7.7  --  10.1 9.7  HGB 7.1* 8.0* 7.0*  --  8.5* 7.7*  HCT 23.3* 27.6* 23.9*  --  28.0* 25.0*  MCV 99.1 101.1* 101.3*  --  98.2 97.7  PLT 58* 65* 55* 57* 67* 62*    Cardiac Enzymes: No results for input(s): CKTOTAL, CKMB, CKMBINDEX, TROPONINI in the last 168 hours.  BNP (last 3 results) No results for input(s): BNP in the last 8760 hours.  ProBNP (last 3 results) No results for input(s): PROBNP in the last 8760 hours.  Radiological Exams: Ir Fluoro Guide Cv Line Right  Result Date: 02/01/2019 INDICATION: End-stage renal disease, in need of placement temporary dialysis catheter for the initiation of dialysis. EXAM: NON-TUNNELED CENTRAL VENOUS HEMODIALYSIS CATHETER PLACEMENT WITH ULTRASOUND AND FLUOROSCOPIC GUIDANCE COMPARISON:  CT abdomen pelvis-01/23/2019 MEDICATIONS: None FLUOROSCOPY TIME:  6 seconds (1 mGy) COMPLICATIONS: None immediate. PROCEDURE: Informed written  consent was obtained from patient's family after a discussion of the risks, benefits, and alternatives to treatment. Questions regarding the procedure were encouraged and answered. Patient presents today in a C-collar which is unable to be removed the decision was made to place a right common femoral vein approach temporary dialysis catheter As such, the right groin was prepped with chlorhexidine in a sterile fashion, and a sterile drape was applied covering the operative field. Maximum barrier sterile technique with sterile gowns and gloves were used for the procedure. A timeout was performed prior to the initiation of the procedure. After the overlying soft tissues were anesthetized, a small venotomy incision was created and a micropuncture kit was utilized to access the right common femoral vein. Real-time ultrasound guidance was utilized for vascular access including the acquisition of a permanent ultrasound image documenting patency of the accessed vessel. The microwire was utilized to measure appropriate catheter length. A stiff glidewire was advanced to the level of the IVC. Under fluoroscopic guidance, the venotomy was serially dilated, ultimately allowing placement of a 24 cm temporary Mahurkar catheter with tip ultimately terminating within the superior aspect of the right atrium. Final catheter positioning was confirmed and documented with a spot fluoroscopic image. The catheter aspirates and flushes normally. The catheter was flushed with appropriate volume heparin dwells. The catheter exit site was secured with a 0-Prolene retention suture. A dressing was placed. The patient tolerated the procedure well without immediate post procedural complication. IMPRESSION: Successful placement of a right common femoral vein approach 24 cm temporary dialysis catheter with tip terminating within the inferior aspect of the IVC. The catheter is ready for immediate use. PLAN: This catheter may be converted to a  tunneled dialysis catheter at a later date as indicated. Electronically Signed   By: Sandi Mariscal M.D.   On: 02/01/2019 14:55   Ir US Guide Vasc Access Right  Result Date: 02/01/2019 INDICATION: End-stage renal disease, in need of placement temporary dialysis catheter for the initiation of dialysis. EXAM: NON-TUNNELED CENTRAL VENOUS HEMODIALYSIS CATHETER PLACEMENT WITH ULTRASOUND AND FLUOROSCOPIC GUIDANCE COMPARISON:  CT  abdomen pelvis-01/23/2019 MEDICATIONS: None FLUOROSCOPY TIME:  6 seconds (1 mGy) COMPLICATIONS: None immediate. PROCEDURE: Informed written consent was obtained from patient's family after a discussion of the risks, benefits, and alternatives to treatment. Questions regarding the procedure were encouraged and answered. Patient presents today in a C-collar which is unable to be removed the decision was made to place a right common femoral vein approach temporary dialysis catheter As such, the right groin was prepped with chlorhexidine in a sterile fashion, and a sterile drape was applied covering the operative field. Maximum barrier sterile technique with sterile gowns and gloves were used for the procedure. A timeout was performed prior to the initiation of the procedure. After the overlying soft tissues were anesthetized, a small venotomy incision was created and a micropuncture kit was utilized to access the right common femoral vein. Real-time ultrasound guidance was utilized for vascular access including the acquisition of a permanent ultrasound image documenting patency of the accessed vessel. The microwire was utilized to measure appropriate catheter length. A stiff glidewire was advanced to the level of the IVC. Under fluoroscopic guidance, the venotomy was serially dilated, ultimately allowing placement of a 24 cm temporary Mahurkar catheter with tip ultimately terminating within the superior aspect of the right atrium. Final catheter positioning was confirmed and documented with a spot  fluoroscopic image. The catheter aspirates and flushes normally. The catheter was flushed with appropriate volume heparin dwells. The catheter exit site was secured with a 0-Prolene retention suture. A dressing was placed. The patient tolerated the procedure well without immediate post procedural complication. IMPRESSION: Successful placement of a right common femoral vein approach 24 cm temporary dialysis catheter with tip terminating within the inferior aspect of the IVC. The catheter is ready for immediate use. PLAN: This catheter may be converted to a tunneled dialysis catheter at a later date as indicated. Electronically Signed   By: Sandi Mariscal M.D.   On: 02/01/2019 14:55   Dg Chest Port 1 View  Result Date: 02/03/2019 CLINICAL DATA:  Dyspnea, pulmonary edema EXAM: PORTABLE CHEST 1 VIEW COMPARISON:  Chest radiograph from one day prior. FINDINGS: Tracheostomy tube tip overlies the tracheal air column at the thoracic inlet. Intact sternotomy wires. Enteric tube enters stomach with the tip not seen on this image. Stable cardiomediastinal silhouette with mild cardiomegaly. No pneumothorax. Small bilateral pleural effusions, left greater than right, unchanged. Extensive fluffy and linear parahilar opacities throughout both lungs, slightly worsened. IMPRESSION: 1. Severe congestive heart failure, slightly worsened. 2. Small stable bilateral pleural effusions, left greater than right. Electronically Signed   By: Ilona Sorrel M.D.   On: 02/03/2019 09:31   Dg Chest Port 1 View  Result Date: 02/02/2019 CLINICAL DATA:  Respiratory failure, tracheostomy EXAM: PORTABLE CHEST 1 VIEW COMPARISON:  Portable exam 1055 hours compared to 02/01/2019 FINDINGS: Tracheostomy and nasogastric tube stable. Rotated to the RIGHT. Enlargement of cardiac silhouette post CABG. Diffuse BILATERAL airspace infiltrates favor pulmonary edema, with small bibasilar effusions. More focal opacity in the LEFT mid lung likely reflects  asymmetric edema though superimposed infection not completely excluded. No pneumothorax. Bones demineralized. IMPRESSION: Persistent severe diffuse BILATERAL interstitial infiltrates favoring asymmetric pulmonary edema, with small associated bibasilar effusions. Electronically Signed   By: Lavonia Dana M.D.   On: 02/02/2019 11:14    Assessment/Plan Active Problems:   Acute on chronic respiratory failure with hypoxia (HCC)   Hyponatremia   Bilateral pneumonia   Chronic atrial fibrillation   Hemothorax with pneumothorax, traumatic, sequela   Hypotension  1. Acute on chronic respiratory failure with hypoxia we will continue with full vent support at this time is not able to do any weaning. 2. Acute renal failure followed by nephrology for dialysis which will be done again today 3. Bilateral pneumonia treated we will continue to monitor 4. Chronic atrial fibrillation rate controlled 5. Hemothorax pneumothorax traumatic continue with supportive care 6. Hypotension hemodynamics are stable at this time.   I have personally seen and evaluated the patient, evaluated laboratory and imaging results, formulated the assessment and plan and placed orders. The Patient requires high complexity decision making for assessment and support.  Case was discussed on Rounds with the Respiratory Therapy Staff  Allyne Gee, MD [facility] Pulmonary Critical Care Medicine Sleep Medicine

## 2019-02-04 ENCOUNTER — Other Ambulatory Visit (HOSPITAL_COMMUNITY): Payer: Non-veteran care

## 2019-02-04 DIAGNOSIS — J9621 Acute and chronic respiratory failure with hypoxia: Secondary | ICD-10-CM | POA: Diagnosis not present

## 2019-02-04 DIAGNOSIS — S272XXS Traumatic hemopneumothorax, sequela: Secondary | ICD-10-CM | POA: Diagnosis not present

## 2019-02-04 DIAGNOSIS — I482 Chronic atrial fibrillation, unspecified: Secondary | ICD-10-CM | POA: Diagnosis not present

## 2019-02-04 DIAGNOSIS — J189 Pneumonia, unspecified organism: Secondary | ICD-10-CM | POA: Diagnosis not present

## 2019-02-04 LAB — CBC
HEMATOCRIT: 22.3 % — AB (ref 39.0–52.0)
HEMATOCRIT: 26.7 % — AB (ref 39.0–52.0)
Hemoglobin: 7 g/dL — ABNORMAL LOW (ref 13.0–17.0)
Hemoglobin: 7.9 g/dL — ABNORMAL LOW (ref 13.0–17.0)
MCH: 30.8 pg (ref 26.0–34.0)
MCH: 29.5 pg (ref 26.0–34.0)
MCHC: 31.4 g/dL (ref 30.0–36.0)
MCHC: 29.6 g/dL — ABNORMAL LOW (ref 30.0–36.0)
MCV: 98.2 fL (ref 80.0–100.0)
MCV: 99.6 fL (ref 80.0–100.0)
Platelets: 55 10*3/uL — ABNORMAL LOW (ref 150–400)
Platelets: 52 10*3/uL — ABNORMAL LOW (ref 150–400)
RBC: 2.27 MIL/uL — ABNORMAL LOW (ref 4.22–5.81)
RBC: 2.68 MIL/uL — ABNORMAL LOW (ref 4.22–5.81)
RDW: 15.8 % — ABNORMAL HIGH (ref 11.5–15.5)
RDW: 15.7 % — ABNORMAL HIGH (ref 11.5–15.5)
WBC: 8.5 10*3/uL (ref 4.0–10.5)
WBC: 10.3 10*3/uL (ref 4.0–10.5)
nRBC: 0 % (ref 0.0–0.2)
nRBC: 0 % (ref 0.0–0.2)

## 2019-02-04 LAB — RENAL FUNCTION PANEL
ALBUMIN: 1.9 g/dL — AB (ref 3.5–5.0)
Albumin: 1.9 g/dL — ABNORMAL LOW (ref 3.5–5.0)
Anion gap: 7 (ref 5–15)
Anion gap: 9 (ref 5–15)
BUN: 126 mg/dL — ABNORMAL HIGH (ref 8–23)
BUN: 129 mg/dL — ABNORMAL HIGH (ref 8–23)
CHLORIDE: 112 mmol/L — AB (ref 98–111)
CO2: 25 mmol/L (ref 22–32)
CO2: 22 mmol/L (ref 22–32)
CREATININE: 2.72 mg/dL — AB (ref 0.61–1.24)
Calcium: 7.5 mg/dL — ABNORMAL LOW (ref 8.9–10.3)
Calcium: 7.6 mg/dL — ABNORMAL LOW (ref 8.9–10.3)
Chloride: 113 mmol/L — ABNORMAL HIGH (ref 98–111)
Creatinine, Ser: 2.76 mg/dL — ABNORMAL HIGH (ref 0.61–1.24)
GFR calc Af Amer: 25 mL/min — ABNORMAL LOW (ref >60)
GFR calc Af Amer: 25 mL/min — ABNORMAL LOW (ref >60)
GFR calc non Af Amer: 22 mL/min — ABNORMAL LOW (ref >60)
GFR calc non Af Amer: 21 mL/min — ABNORMAL LOW (ref >60)
Glucose, Bld: 136 mg/dL — ABNORMAL HIGH (ref 70–99)
Glucose, Bld: 136 mg/dL — ABNORMAL HIGH (ref 70–99)
POTASSIUM: 5.4 mmol/L — AB (ref 3.5–5.1)
Phosphorus: 6.1 mg/dL — ABNORMAL HIGH (ref 2.5–4.6)
Phosphorus: 6.4 mg/dL — ABNORMAL HIGH (ref 2.5–4.6)
Potassium: 5.2 mmol/L — ABNORMAL HIGH (ref 3.5–5.1)
Sodium: 144 mmol/L (ref 135–145)
Sodium: 144 mmol/L (ref 135–145)

## 2019-02-04 LAB — TSH: TSH: 3.176 u[IU]/mL (ref 0.350–4.500)

## 2019-02-04 LAB — AMMONIA: Ammonia: 20 umol/L (ref 9–35)

## 2019-02-04 LAB — LACTIC ACID, PLASMA: Lactic Acid, Venous: 1.3 mmol/L (ref 0.5–1.9)

## 2019-02-04 LAB — BRAIN NATRIURETIC PEPTIDE: B Natriuretic Peptide: 394.9 pg/mL — ABNORMAL HIGH (ref 0.0–100.0)

## 2019-02-04 LAB — T4, FREE: Free T4: 0.47 ng/dL — ABNORMAL LOW (ref 0.82–1.77)

## 2019-02-04 LAB — MAGNESIUM: Magnesium: 2.3 mg/dL (ref 1.7–2.4)

## 2019-02-04 LAB — PREPARE RBC (CROSSMATCH)
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
## 2019-02-04 NOTE — Progress Notes (Signed)
Pulmonary Critical Care Medicine [place]   PULMONARY CRITICAL CARE SERVICE  PROGRESS NOTE  Date of Service: 02/04/2019  Gabriel Jensen  URK:[id number]  DOB: 09/12/1941   DOA: 01/21/2019  Referring Physician: Merton Border, MD  HPI: Gabriel Jensen is a 78 y.o. male seen for follow up of Acute on Chronic Respiratory Failure.  Patient is on full support currently on pressure assist control on 50% FiO2.  Patient's going to be continuing to get dialysis for nephrology recommendations  Medications: Reviewed on Rounds  Physical Exam:  Vitals: Temperature 98.0 pulse 61 respiratory 22 blood pressure 111/54 saturations 97%  Ventilator Settings mode ventilation pressure assist control FiO2 50% PEEP 10 tidal volume 474  . General: Comfortable at this time . Eyes: Grossly normal lids, irises & conjunctiva . ENT: grossly tongue is normal . Neck: no obvious mass . Cardiovascular: S1 S2 normal no gallop . Respiratory: no rhonchi . Abdomen: soft . Skin: no rash seen on limited exam . Musculoskeletal: not rigid . Psychiatric:unable to assess . Neurologic: no seizure no involuntary movements         Lab Data:   Basic Metabolic Panel: Recent Labs  Lab 01/30/19 0420  02/02/19 0928 02/02/19 1150 02/02/19 1201 02/03/19 0558 02/03/19 1536 02/04/19 0404 02/04/19 1031  NA 145   < > 143 142 143 145 143 144 144  K 5.2*   < > 5.3* 5.5* 5.6* 5.2* 4.7 5.2* 5.4*  CL 119*   < > 114* 114* 114* 115* 112* 112* 113*  CO2 18*   < > 20* 17* 19* 21* 22 25 22   GLUCOSE 148*   < > 145* 136* 135* 154* 140* 136* 136*  BUN 103*   < > 128* 131* 129* 136* 109* 126* 129*  CREATININE 3.04*   < > 3.11* 3.60* 3.19* 3.12* 2.51* 2.72* 2.76*  CALCIUM 7.3*   < > 7.5* 7.5* 7.5* 7.5* 7.5* 7.5* 7.6*  MG 2.2  --   --   --   --   --   --  2.3  --   PHOS  --    < > 6.5* 6.6*  --  6.8*  --  6.1* 6.4*   < > = values in this interval not displayed.    ABG: Recent Labs  Lab 02/01/19 0337  02/01/19 0617 02/01/19 0825 02/01/19 1510 02/02/19 0450  PHART 7.171* 7.216* 7.235* 7.218* 7.311*  PCO2ART 58.2* 51.2* 50.6* 53.5* 41.0  PO2ART 84.0 88.3 82.1* 90.3 112*  HCO3 20.4 20.0 20.7 21.0 20.2  O2SAT 95.5 96.9 95.5 96.2 98.3    Liver Function Tests: Recent Labs  Lab 01/30/19 0420  02/02/19 0928 02/02/19 1150 02/03/19 0558 02/04/19 0404 02/04/19 1031  AST 22  --   --   --   --   --   --   ALT 27  --   --   --   --   --   --   ALKPHOS 57  --   --   --   --   --   --   BILITOT 0.6  --   --   --   --   --   --   PROT 5.4*  --   --   --   --   --   --   ALBUMIN 1.6*   < > 1.9* 1.9* 1.8* 1.9* 1.9*   < > = values in this interval not displayed.   No results for input(s): LIPASE,  AMYLASE in the last 168 hours. Recent Labs  Lab 02/04/19 0404  AMMONIA 20    CBC: Recent Labs  Lab 02/01/19 1058 02/01/19 1827 02/02/19 1150 02/03/19 0558 02/04/19 0404 02/04/19 1031  WBC 7.7  --  10.1 9.7 8.5 10.3  HGB 7.0*  --  8.5* 7.7* 7.0* 7.9*  HCT 23.9*  --  28.0* 25.0* 22.3* 26.7*  MCV 101.3*  --  98.2 97.7 98.2 99.6  PLT 55* 57* 67* 62* 55* 52*    Cardiac Enzymes: No results for input(s): CKTOTAL, CKMB, CKMBINDEX, TROPONINI in the last 168 hours.  BNP (last 3 results) Recent Labs    02/04/19 0404  BNP 394.9*    ProBNP (last 3 results) No results for input(s): PROBNP in the last 8760 hours.  Radiological Exams: Dg Chest Port 1 View  Result Date: 02/04/2019 CLINICAL DATA:  Pleural effusion.  CHF EXAM: PORTABLE CHEST 1 VIEW COMPARISON:  Yesterday FINDINGS: Chronic cardiomegaly and vascular pedicle widening. CABG. Diffuse interstitial opacity that is nonspecific. Probable layering pleural fluid, greater on the left. Tracheostomy tube that is well seated. The orogastric tube reaches the stomach. IMPRESSION: 1. Stable hardware positioning. 2. Unchanged bilateral interstitial opacity with probable layering pleural effusions. Electronically Signed   By: Monte Fantasia  M.D.   On: 02/04/2019 09:30   Dg Chest Port 1 View  Result Date: 02/03/2019 CLINICAL DATA:  Dyspnea, pulmonary edema EXAM: PORTABLE CHEST 1 VIEW COMPARISON:  Chest radiograph from one day prior. FINDINGS: Tracheostomy tube tip overlies the tracheal air column at the thoracic inlet. Intact sternotomy wires. Enteric tube enters stomach with the tip not seen on this image. Stable cardiomediastinal silhouette with mild cardiomegaly. No pneumothorax. Small bilateral pleural effusions, left greater than right, unchanged. Extensive fluffy and linear parahilar opacities throughout both lungs, slightly worsened. IMPRESSION: 1. Severe congestive heart failure, slightly worsened. 2. Small stable bilateral pleural effusions, left greater than right. Electronically Signed   By: Ilona Sorrel M.D.   On: 02/03/2019 09:31    Assessment/Plan Active Problems:   Acute on chronic respiratory failure with hypoxia (HCC)   Hyponatremia   Bilateral pneumonia   Chronic atrial fibrillation   Hemothorax with pneumothorax, traumatic, sequela   Hypotension   1. Acute on chronic respiratory failure with hypoxia at this time patient is on full support and pressure assist control mode right away oxygen is at 50% try to titrate down if patient is able to tolerate PEEP remains at 10 however. 2. Acute renal failure patient is supposed to have dialysis again per nephrology 3. Bilateral pneumonia treated patient still has significant congestive heart failure on the chest x-ray 4. Hemothorax pneumothorax resolved we will continue with supportive care 5. Chronic atrial fibrillation rate controlled 6. Hypotension resolved we will continue present management   I have personally seen and evaluated the patient, evaluated laboratory and imaging results, formulated the assessment and plan and placed orders. The Patient requires high complexity decision making for assessment and support.  Case was discussed on Rounds with the  Respiratory Therapy Staff  Allyne Gee, MD [facility] Pulmonary Critical Care Medicine Sleep Medicine

## 2019-02-05 DIAGNOSIS — J189 Pneumonia, unspecified organism: Secondary | ICD-10-CM | POA: Diagnosis not present

## 2019-02-05 DIAGNOSIS — J9621 Acute and chronic respiratory failure with hypoxia: Secondary | ICD-10-CM | POA: Diagnosis not present

## 2019-02-05 DIAGNOSIS — S272XXS Traumatic hemopneumothorax, sequela: Secondary | ICD-10-CM | POA: Diagnosis not present

## 2019-02-05 DIAGNOSIS — I482 Chronic atrial fibrillation, unspecified: Secondary | ICD-10-CM | POA: Diagnosis not present

## 2019-02-05 LAB — BPAM RBC
Blood Product Expiration Date: 202003112359
Blood Product Expiration Date: 202003122359
ISSUE DATE / TIME: 202002162044
ISSUE DATE / TIME: 202002191719
Unit Type and Rh: 8400
Unit Type and Rh: 8400

## 2019-02-05 LAB — RENAL FUNCTION PANEL
Albumin: 2.1 g/dL — ABNORMAL LOW (ref 3.5–5.0)
Anion gap: 10 (ref 5–15)
BUN: 86 mg/dL — ABNORMAL HIGH (ref 8–23)
CHLORIDE: 106 mmol/L (ref 98–111)
CO2: 24 mmol/L (ref 22–32)
Calcium: 7.6 mg/dL — ABNORMAL LOW (ref 8.9–10.3)
Creatinine, Ser: 2.06 mg/dL — ABNORMAL HIGH (ref 0.61–1.24)
GFR calc non Af Amer: 30 mL/min — ABNORMAL LOW (ref >60)
GFR, EST AFRICAN AMERICAN: 35 mL/min — AB (ref >60)
Glucose, Bld: 159 mg/dL — ABNORMAL HIGH (ref 70–99)
Phosphorus: 5.4 mg/dL — ABNORMAL HIGH (ref 2.5–4.6)
Potassium: 4.7 mmol/L (ref 3.5–5.1)
Sodium: 140 mmol/L (ref 135–145)

## 2019-02-05 LAB — CBC
HCT: 29 % — ABNORMAL LOW (ref 39.0–52.0)
Hemoglobin: 8.8 g/dL — ABNORMAL LOW (ref 13.0–17.0)
MCH: 29.1 pg (ref 26.0–34.0)
MCHC: 30.3 g/dL (ref 30.0–36.0)
MCV: 96 fL (ref 80.0–100.0)
PLATELETS: 54 10*3/uL — AB (ref 150–400)
RBC: 3.02 MIL/uL — ABNORMAL LOW (ref 4.22–5.81)
RDW: 17.8 % — ABNORMAL HIGH (ref 11.5–15.5)
WBC: 13.9 10*3/uL — ABNORMAL HIGH (ref 4.0–10.5)
nRBC: 0 % (ref 0.0–0.2)

## 2019-02-05 LAB — MAGNESIUM: Magnesium: 2.1 mg/dL (ref 1.7–2.4)

## 2019-02-05 LAB — TYPE AND SCREEN
ABO/RH(D): AB POS
Antibody Screen: NEGATIVE
Unit division: 0
Unit division: 0

## 2019-02-05 LAB — URINALYSIS, ROUTINE W REFLEX MICROSCOPIC
Bacteria, UA: NONE SEEN
Bilirubin Urine: NEGATIVE
Glucose, UA: NEGATIVE mg/dL
Ketones, ur: NEGATIVE mg/dL
Leukocytes,Ua: NEGATIVE
NITRITE: NEGATIVE
Protein, ur: 30 mg/dL — AB
SPECIFIC GRAVITY, URINE: 1.016 (ref 1.005–1.030)
pH: 5 (ref 5.0–8.0)

## 2019-02-05 LAB — HEMOGLOBIN A1C
HEMOGLOBIN A1C: 5.8 % — AB (ref 4.8–5.6)
Mean Plasma Glucose: 120 mg/dL

## 2019-02-05 NOTE — Progress Notes (Signed)
Pulmonary Critical Care Medicine [place]   PULMONARY CRITICAL CARE SERVICE  PROGRESS NOTE  Date of Service: 02/05/2019  Gabriel Jensen  NWG:[id number]  DOB: 1941/12/06   DOA: 01/21/2019  Referring Physician: Merton Border, MD  HPI: Gabriel Jensen is a 78 y.o. male seen for follow up of Acute on Chronic Respiratory Failure.  Patient remains on full support oxygen level was increased to inspiratory pressure of 30  Medications: Reviewed on Rounds  Physical Exam:  Vitals: Temperature 97.8 pulse 61 respiratory rate 20 blood pressure 137/68 saturations 100%  Ventilator Settings currently on pressure assist control inspiratory pressure 30 PEEP 8  . General: Comfortable at this time . Eyes: Grossly normal lids, irises & conjunctiva . ENT: grossly tongue is normal . Neck: no obvious mass . Cardiovascular: S1 S2 normal no gallop . Respiratory: No rhonchi no rales are noted at this time . Abdomen: soft . Skin: no rash seen on limited exam . Musculoskeletal: not rigid . Psychiatric:unable to assess . Neurologic: no seizure no involuntary movements         Lab Data:   Basic Metabolic Panel: Recent Labs  Lab 01/30/19 0420  02/02/19 1150  02/03/19 0558 02/03/19 1536 02/04/19 0404 02/04/19 1031 02/05/19 0507  NA 145   < > 142   < > 145 143 144 144 140  K 5.2*   < > 5.5*   < > 5.2* 4.7 5.2* 5.4* 4.7  CL 119*   < > 114*   < > 115* 112* 112* 113* 106  CO2 18*   < > 17*   < > 21* 22 25 22 24   GLUCOSE 148*   < > 136*   < > 154* 140* 136* 136* 159*  BUN 103*   < > 131*   < > 136* 109* 126* 129* 86*  CREATININE 3.04*   < > 3.60*   < > 3.12* 2.51* 2.72* 2.76* 2.06*  CALCIUM 7.3*   < > 7.5*   < > 7.5* 7.5* 7.5* 7.6* 7.6*  MG 2.2  --   --   --   --   --  2.3  --  2.1  PHOS  --    < > 6.6*  --  6.8*  --  6.1* 6.4* 5.4*   < > = values in this interval not displayed.    ABG: Recent Labs  Lab 02/01/19 0337 02/01/19 0617 02/01/19 0825 02/01/19 1510 02/02/19 0450   PHART 7.171* 7.216* 7.235* 7.218* 7.311*  PCO2ART 58.2* 51.2* 50.6* 53.5* 41.0  PO2ART 84.0 88.3 82.1* 90.3 112*  HCO3 20.4 20.0 20.7 21.0 20.2  O2SAT 95.5 96.9 95.5 96.2 98.3    Liver Function Tests: Recent Labs  Lab 01/30/19 0420  02/02/19 1150 02/03/19 0558 02/04/19 0404 02/04/19 1031 02/05/19 0507  AST 22  --   --   --   --   --   --   ALT 27  --   --   --   --   --   --   ALKPHOS 57  --   --   --   --   --   --   BILITOT 0.6  --   --   --   --   --   --   PROT 5.4*  --   --   --   --   --   --   ALBUMIN 1.6*   < > 1.9* 1.8* 1.9* 1.9* 2.1*   < > =  values in this interval not displayed.   No results for input(s): LIPASE, AMYLASE in the last 168 hours. Recent Labs  Lab 02/04/19 0404  AMMONIA 20    CBC: Recent Labs  Lab 02/02/19 1150 02/03/19 0558 02/04/19 0404 02/04/19 1031 02/05/19 0507  WBC 10.1 9.7 8.5 10.3 13.9*  HGB 8.5* 7.7* 7.0* 7.9* 8.8*  HCT 28.0* 25.0* 22.3* 26.7* 29.0*  MCV 98.2 97.7 98.2 99.6 96.0  PLT 67* 62* 55* 52* 54*    Cardiac Enzymes: No results for input(s): CKTOTAL, CKMB, CKMBINDEX, TROPONINI in the last 168 hours.  BNP (last 3 results) Recent Labs    02/04/19 0404  BNP 394.9*    ProBNP (last 3 results) No results for input(s): PROBNP in the last 8760 hours.  Radiological Exams: Dg Chest Port 1 View  Result Date: 02/04/2019 CLINICAL DATA:  Pleural effusion.  CHF EXAM: PORTABLE CHEST 1 VIEW COMPARISON:  Yesterday FINDINGS: Chronic cardiomegaly and vascular pedicle widening. CABG. Diffuse interstitial opacity that is nonspecific. Probable layering pleural fluid, greater on the left. Tracheostomy tube that is well seated. The orogastric tube reaches the stomach. IMPRESSION: 1. Stable hardware positioning. 2. Unchanged bilateral interstitial opacity with probable layering pleural effusions. Electronically Signed   By: Monte Fantasia M.D.   On: 02/04/2019 09:30    Assessment/Plan Active Problems:   Acute on chronic respiratory  failure with hypoxia (HCC)   Hyponatremia   Bilateral pneumonia   Chronic atrial fibrillation   Hemothorax with pneumothorax, traumatic, sequela   Hypotension   1. Acute on chronic respiratory failure with hypoxia we will continue with oxygen therapy and continue on pressure assist control mode.  As the dialysis helps to remove more fluid hopefully then we can begin to wean the PEEP and FiO2 2. Bilateral pneumonia chest x-ray results noted showing some effusions also will continue to monitor 3. Chronic atrial fibrillation rate controlled 4. Hemothorax and pneumothorax resolved 5. Hypotension resolved continue with supportive care   I have personally seen and evaluated the patient, evaluated laboratory and imaging results, formulated the assessment and plan and placed orders. The Patient requires high complexity decision making for assessment and support.  Case was discussed on Rounds with the Respiratory Therapy Staff  Allyne Gee, MD [facility] Pulmonary Critical Care Medicine Sleep Medicine

## 2019-02-06 DIAGNOSIS — I482 Chronic atrial fibrillation, unspecified: Secondary | ICD-10-CM | POA: Diagnosis not present

## 2019-02-06 DIAGNOSIS — J189 Pneumonia, unspecified organism: Secondary | ICD-10-CM | POA: Diagnosis not present

## 2019-02-06 DIAGNOSIS — J9621 Acute and chronic respiratory failure with hypoxia: Secondary | ICD-10-CM | POA: Diagnosis not present

## 2019-02-06 DIAGNOSIS — S272XXS Traumatic hemopneumothorax, sequela: Secondary | ICD-10-CM | POA: Diagnosis not present

## 2019-02-06 LAB — RENAL FUNCTION PANEL
ALBUMIN: 1.9 g/dL — AB (ref 3.5–5.0)
Anion gap: 10 (ref 5–15)
BUN: 109 mg/dL — ABNORMAL HIGH (ref 8–23)
CO2: 25 mmol/L (ref 22–32)
Calcium: 7.5 mg/dL — ABNORMAL LOW (ref 8.9–10.3)
Chloride: 105 mmol/L (ref 98–111)
Creatinine, Ser: 2.45 mg/dL — ABNORMAL HIGH (ref 0.61–1.24)
GFR calc Af Amer: 28 mL/min — ABNORMAL LOW (ref >60)
GFR calc non Af Amer: 24 mL/min — ABNORMAL LOW (ref >60)
Glucose, Bld: 146 mg/dL — ABNORMAL HIGH (ref 70–99)
PHOSPHORUS: 5.7 mg/dL — AB (ref 2.5–4.6)
POTASSIUM: 5.2 mmol/L — AB (ref 3.5–5.1)
Sodium: 140 mmol/L (ref 135–145)

## 2019-02-06 LAB — CBC
HCT: 27.2 % — ABNORMAL LOW (ref 39.0–52.0)
Hemoglobin: 8.7 g/dL — ABNORMAL LOW (ref 13.0–17.0)
MCH: 30.3 pg (ref 26.0–34.0)
MCHC: 32 g/dL (ref 30.0–36.0)
MCV: 94.8 fL (ref 80.0–100.0)
Platelets: 55 10*3/uL — ABNORMAL LOW (ref 150–400)
RBC: 2.87 MIL/uL — ABNORMAL LOW (ref 4.22–5.81)
RDW: 17 % — ABNORMAL HIGH (ref 11.5–15.5)
WBC: 15.3 10*3/uL — AB (ref 4.0–10.5)
nRBC: 0 % (ref 0.0–0.2)

## 2019-02-06 LAB — URINE CULTURE: Culture: NO GROWTH

## 2019-02-06 LAB — PREPARE RBC (CROSSMATCH)
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
## 2019-02-06 NOTE — Progress Notes (Signed)
Pulmonary Critical Care Medicine [place]   PULMONARY CRITICAL CARE SERVICE  PROGRESS NOTE  Date of Service: 02/06/2019  Pasha Broad  GEZ:[id number]  DOB: 13-May-1941   DOA: 01/21/2019  Referring Physician: Merton Border, MD  HPI: Gabriel Jensen is a 78 y.o. male seen for follow up of Acute on Chronic Respiratory Failure.  Patient currently is on full support on pressure control mode right now is on 45% FiO2 with an inspiratory pressure of 30 PEEP 8  Medications: Reviewed on Rounds  Physical Exam:  Vitals: Temperature 97.6 pulse 62 respiratory 18 blood pressure 112/62 saturations 99%  Ventilator Settings mode ventilation pressure assist control FiO2 45% tidal volume 42 respiratory pressure 30 PEEP 8  . General: Comfortable at this time . Eyes: Grossly normal lids, irises & conjunctiva . ENT: grossly tongue is normal . Neck: no obvious mass . Cardiovascular: S1 S2 normal no gallop . Respiratory: No rhonchi or rales are noted at this time . Abdomen: soft . Skin: no rash seen on limited exam . Musculoskeletal: not rigid . Psychiatric:unable to assess . Neurologic: no seizure no involuntary movements         Lab Data:   Basic Metabolic Panel: Recent Labs  Lab 02/03/19 0558 02/03/19 1536 02/04/19 0404 02/04/19 1031 02/05/19 0507 02/06/19 0656  NA 145 143 144 144 140 140  K 5.2* 4.7 5.2* 5.4* 4.7 5.2*  CL 115* 112* 112* 113* 106 105  CO2 21* 22 25 22 24 25   GLUCOSE 154* 140* 136* 136* 159* 146*  BUN 136* 109* 126* 129* 86* 109*  CREATININE 3.12* 2.51* 2.72* 2.76* 2.06* 2.45*  CALCIUM 7.5* 7.5* 7.5* 7.6* 7.6* 7.5*  MG  --   --  2.3  --  2.1  --   PHOS 6.8*  --  6.1* 6.4* 5.4* 5.7*    ABG: Recent Labs  Lab 02/01/19 0337 02/01/19 0617 02/01/19 0825 02/01/19 1510 02/02/19 0450  PHART 7.171* 7.216* 7.235* 7.218* 7.311*  PCO2ART 58.2* 51.2* 50.6* 53.5* 41.0  PO2ART 84.0 88.3 82.1* 90.3 112*  HCO3 20.4 20.0 20.7 21.0 20.2  O2SAT 95.5 96.9  95.5 96.2 98.3    Liver Function Tests: Recent Labs  Lab 02/03/19 0558 02/04/19 0404 02/04/19 1031 02/05/19 0507 02/06/19 0656  ALBUMIN 1.8* 1.9* 1.9* 2.1* 1.9*   No results for input(s): LIPASE, AMYLASE in the last 168 hours. Recent Labs  Lab 02/04/19 0404  AMMONIA 20    CBC: Recent Labs  Lab 02/03/19 0558 02/04/19 0404 02/04/19 1031 02/05/19 0507 02/06/19 0656  WBC 9.7 8.5 10.3 13.9* 15.3*  HGB 7.7* 7.0* 7.9* 8.8* 8.7*  HCT 25.0* 22.3* 26.7* 29.0* 27.2*  MCV 97.7 98.2 99.6 96.0 94.8  PLT 62* 55* 52* 54* 55*    Cardiac Enzymes: No results for input(s): CKTOTAL, CKMB, CKMBINDEX, TROPONINI in the last 168 hours.  BNP (last 3 results) Recent Labs    02/04/19 0404  BNP 394.9*    ProBNP (last 3 results) No results for input(s): PROBNP in the last 8760 hours.  Radiological Exams: No results found.  Assessment/Plan Active Problems:   Acute on chronic respiratory failure with hypoxia (HCC)   Hyponatremia   Bilateral pneumonia   Chronic atrial fibrillation   Hemothorax with pneumothorax, traumatic, sequela   Hypotension   1. Acute on chronic respiratory failure with hypoxia patient has been having some issues with bleeding from the trach platelets are low primary care team is aware and they are working on it.  I have  instructed respiratory therapist to be extra careful with suctioning and not to be too aggressive to keep suctioning to minimum if at all possible. 2. Bilateral pneumonia treated we will continue with supportive care. 3. Chronic atrial fibrillation rate is controlled at this time 4. Hemothorax pneumothorax improved 5. Hypotension resolved 6. Hyponatremia improved followed by nephrology   I have personally seen and evaluated the patient, evaluated laboratory and imaging results, formulated the assessment and plan and placed orders. The Patient requires high complexity decision making for assessment and support.  Case was discussed on Rounds  with the Respiratory Therapy Staff  Allyne Gee, MD [facility] Pulmonary Critical Care Medicine Sleep Medicine

## 2019-02-07 ENCOUNTER — Emergency Department (HOSPITAL_COMMUNITY)
Admission: EM | Admit: 2019-02-07 | Discharge: 2019-02-07 | Disposition: A | Discharge status: Other Acute Inpatient Hospital | Payer: Medicare Other | Attending: Emergency Medicine | Admitting: Emergency Medicine

## 2019-02-07 DIAGNOSIS — I4891 Unspecified atrial fibrillation: Secondary | ICD-10-CM | POA: Insufficient documentation

## 2019-02-07 DIAGNOSIS — J9501 Hemorrhage from tracheostomy stoma: Secondary | ICD-10-CM | POA: Diagnosis present

## 2019-02-07 LAB — CBC WITH DIFFERENTIAL/PLATELET
Abs Immature Granulocytes: 0.08 10*3/uL — ABNORMAL HIGH (ref 0.00–0.07)
Basophils Absolute: 0 10*3/uL (ref 0.0–0.1)
Basophils Relative: 0 %
Eosinophils Absolute: 0 10*3/uL (ref 0.0–0.5)
Eosinophils Relative: 0 %
HCT: 26.4 % — ABNORMAL LOW (ref 39.0–52.0)
Hemoglobin: 8.1 g/dL — ABNORMAL LOW (ref 13.0–17.0)
Immature Granulocytes: 1 %
Lymphocytes Relative: 3 %
Lymphs Abs: 0.4 10*3/uL — ABNORMAL LOW (ref 0.7–4.0)
MCH: 29 pg (ref 26.0–34.0)
MCHC: 30.7 g/dL (ref 30.0–36.0)
MCV: 94.6 fL (ref 80.0–100.0)
Monocytes Absolute: 0.4 10*3/uL (ref 0.1–1.0)
Monocytes Relative: 3 %
NEUTROS PCT: 93 %
NRBC: 0 % (ref 0.0–0.2)
Neutro Abs: 15 10*3/uL — ABNORMAL HIGH (ref 1.7–7.7)
Platelets: DECREASED 10*3/uL (ref 150–400)
RBC: 2.79 MIL/uL — ABNORMAL LOW (ref 4.22–5.81)
RDW: 16.3 % — ABNORMAL HIGH (ref 11.5–15.5)
WBC: 15.9 10*3/uL — ABNORMAL HIGH (ref 4.0–10.5)

## 2019-02-07 LAB — CBC
HCT: 27.2 % — ABNORMAL LOW (ref 39.0–52.0)
HEMATOCRIT: 26 % — AB (ref 39.0–52.0)
HEMOGLOBIN: 8.5 g/dL — AB (ref 13.0–17.0)
Hemoglobin: 8.1 g/dL — ABNORMAL LOW (ref 13.0–17.0)
MCH: 29.7 pg (ref 26.0–34.0)
MCH: 29.2 pg (ref 26.0–34.0)
MCHC: 31.3 g/dL (ref 30.0–36.0)
MCHC: 31.2 g/dL (ref 30.0–36.0)
MCV: 95.1 fL (ref 80.0–100.0)
MCV: 93.9 fL (ref 80.0–100.0)
Platelets: 50 10*3/uL — ABNORMAL LOW (ref 150–400)
Platelets: 51 10*3/uL — ABNORMAL LOW (ref 150–400)
RBC: 2.86 MIL/uL — ABNORMAL LOW (ref 4.22–5.81)
RBC: 2.77 MIL/uL — ABNORMAL LOW (ref 4.22–5.81)
RDW: 16.3 % — ABNORMAL HIGH (ref 11.5–15.5)
RDW: 16.3 % — ABNORMAL HIGH (ref 11.5–15.5)
WBC: 15.3 10*3/uL — ABNORMAL HIGH (ref 4.0–10.5)
WBC: 14.6 10*3/uL — ABNORMAL HIGH (ref 4.0–10.5)
nRBC: 0 % (ref 0.0–0.2)
nRBC: 0 % (ref 0.0–0.2)

## 2019-02-07 LAB — BASIC METABOLIC PANEL
Anion gap: 8 (ref 5–15)
BUN: 74 mg/dL — ABNORMAL HIGH (ref 8–23)
CO2: 26 mmol/L (ref 22–32)
Calcium: 7.5 mg/dL — ABNORMAL LOW (ref 8.9–10.3)
Chloride: 103 mmol/L (ref 98–111)
Creatinine, Ser: 1.87 mg/dL — ABNORMAL HIGH (ref 0.61–1.24)
GFR calc non Af Amer: 34 mL/min — ABNORMAL LOW (ref >60)
GFR, EST AFRICAN AMERICAN: 39 mL/min — AB (ref >60)
Glucose, Bld: 130 mg/dL — ABNORMAL HIGH (ref 70–99)
Potassium: 4.4 mmol/L (ref 3.5–5.1)
Sodium: 137 mmol/L (ref 135–145)

## 2019-02-07 LAB — RENAL FUNCTION PANEL
ANION GAP: 9 (ref 5–15)
Albumin: 2 g/dL — ABNORMAL LOW (ref 3.5–5.0)
BUN: 65 mg/dL — ABNORMAL HIGH (ref 8–23)
CO2: 26 mmol/L (ref 22–32)
Calcium: 7.3 mg/dL — ABNORMAL LOW (ref 8.9–10.3)
Chloride: 102 mmol/L (ref 98–111)
Creatinine, Ser: 1.71 mg/dL — ABNORMAL HIGH (ref 0.61–1.24)
GFR calc Af Amer: 44 mL/min — ABNORMAL LOW (ref >60)
GFR calc non Af Amer: 38 mL/min — ABNORMAL LOW (ref >60)
Glucose, Bld: 149 mg/dL — ABNORMAL HIGH (ref 70–99)
Phosphorus: 3.9 mg/dL (ref 2.5–4.6)
Potassium: 4.3 mmol/L (ref 3.5–5.1)
Sodium: 137 mmol/L (ref 135–145)

## 2019-02-07 LAB — PROTIME-INR
INR: 1.3
PROTHROMBIN TIME: 16 s — AB (ref 11.4–15.2)

## 2019-02-07 LAB — MAGNESIUM: Magnesium: 2 mg/dL (ref 1.7–2.4)

## 2019-02-07 NOTE — ED Notes (Signed)
Respiratory therapy bedside.

## 2019-02-07 NOTE — Progress Notes (Signed)
[facility] from [facility]. Pt trach is bleeding from the outsider. No noted bloody secretions sx from trach or orally.

## 2019-02-07 NOTE — ED Notes (Signed)
Respiratory @ bedside
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
## 2019-02-07 NOTE — ED Notes (Addendum)
Patient unable to sign for consent to transfer to [facility].  Multiple attempts made by this RN to call Gardiner Coins (son) to obtain consent.

## 2019-02-07 NOTE — ED Notes (Signed)
[facility] transport bedside
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
## 2019-02-07 NOTE — ED Notes (Signed)
ED Provider at bedside. 

## 2019-02-07 NOTE — ED Notes (Signed)
Spoke with [facility] transport for report.

## 2019-02-07 NOTE — Progress Notes (Signed)
Patient removed from [facility] vent and placed on portable vent by [employer] care.

## 2019-02-07 NOTE — ED Notes (Signed)
This RN spoke with son, Imad Shostak, on the phone. Son verbally gave consent for patient to be transported to [facility] and verbalizes understanding of risks and benefits.

## 2019-02-08 LAB — TYPE AND SCREEN
ABO/RH(D): AB POS
Antibody Screen: NEGATIVE
Unit division: 0

## 2019-02-08 LAB — BPAM RBC
Blood Product Expiration Date: 202003122359
ISSUE DATE / TIME: 202002221023
Unit Type and Rh: 8400

## 2019-02-08 LAB — BPAM PLATELET PHERESIS
Blood Product Expiration Date: 202002222359
Blood Product Expiration Date: 202002232359
ISSUE DATE / TIME: 202002221034
Unit Type and Rh: 6200
Unit Type and Rh: 6200

## 2019-02-08 LAB — PREPARE PLATELET PHERESIS
Unit division: 0
Unit division: 0

## 2019-02-09 LAB — CULTURE, RESPIRATORY W GRAM STAIN

## 2019-02-10 LAB — CULTURE, BLOOD (ROUTINE X 2)
Culture: NO GROWTH
Culture: NO GROWTH
SPECIAL REQUESTS: ADEQUATE
Special Requests: ADEQUATE

## 2019-02-15 DEATH — deceased
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
## 2020-07-03 IMAGING — DX DG CHEST 1V PORT
1 series · 1 of 1 positions shown · non-contrast
Comparison: 01/21/2019

CLINICAL DATA: Fever.  Follow-up.

EXAM:
PORTABLE CHEST 1 VIEW

[chest ap]
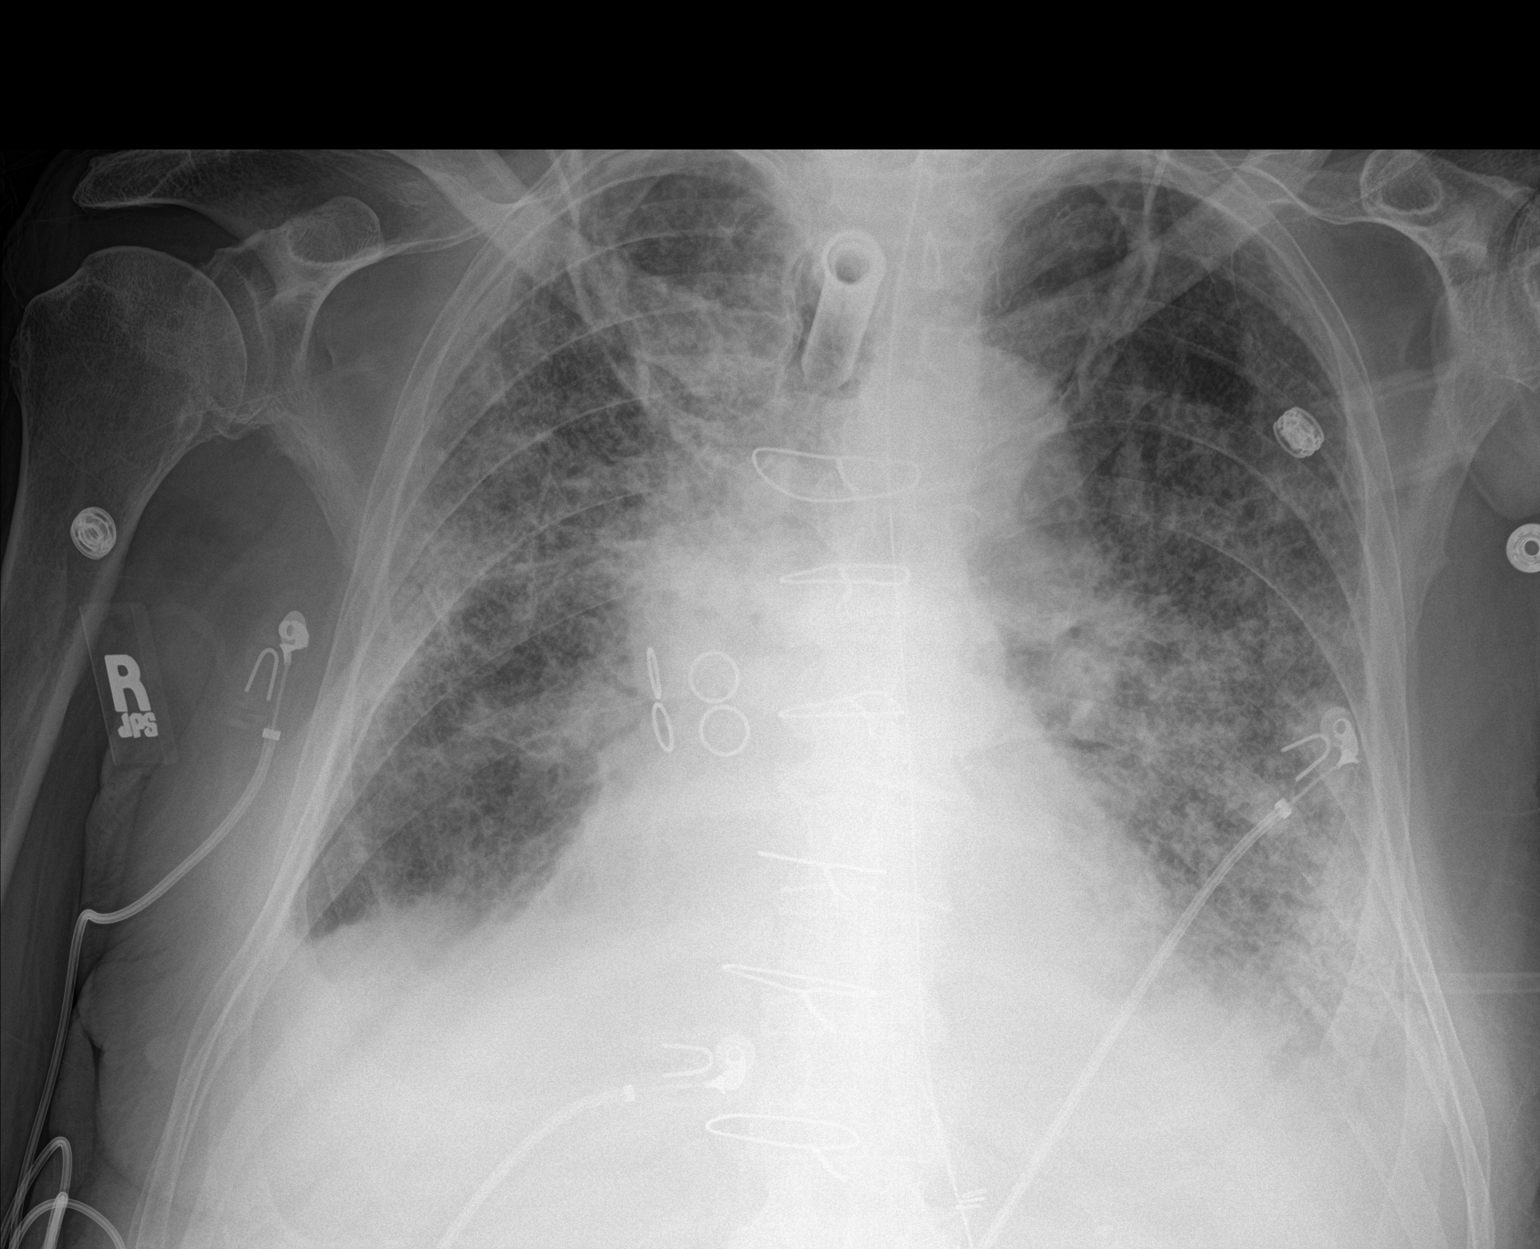

[1 of 1 positions shown; findings below may reference images not displayed]

FINDINGS: Tracheostomy remains in place. Nasogastric tube enters the abdomen.
Widespread bilateral pulmonary opacity persists consistent with
diffuse pneumonia. No dense consolidation or lobar collapse. Small
bilateral effusions.
IMPRESSION: Similar appearance to the study of 2 days ago. Widespread pulmonary
infiltrates and small effusions.
